# Patient Record
Sex: Female | Born: 1985 | Hispanic: No | Marital: Married | Smoking: Never smoker
Health system: Southern US, Community
[De-identification: ages and names within clinical notes are randomized; demographics above are authoritative.]

## PROBLEM LIST (undated history)

## (undated) DIAGNOSIS — J45909 Unspecified asthma, uncomplicated: Secondary | ICD-10-CM

## (undated) DIAGNOSIS — G43909 Migraine, unspecified, not intractable, without status migrainosus: Secondary | ICD-10-CM

## (undated) DIAGNOSIS — F988 Other specified behavioral and emotional disorders with onset usually occurring in childhood and adolescence: Secondary | ICD-10-CM

## (undated) DIAGNOSIS — K900 Celiac disease: Secondary | ICD-10-CM

## (undated) DIAGNOSIS — F329 Major depressive disorder, single episode, unspecified: Secondary | ICD-10-CM

## (undated) DIAGNOSIS — I499 Cardiac arrhythmia, unspecified: Secondary | ICD-10-CM

## (undated) DIAGNOSIS — F32A Depression, unspecified: Secondary | ICD-10-CM

## (undated) HISTORY — DX: Migraine, unspecified, not intractable, without status migrainosus: G43.909

## (undated) HISTORY — DX: Other specified behavioral and emotional disorders with onset usually occurring in childhood and adolescence: F98.8

## (undated) HISTORY — DX: Cardiac arrhythmia, unspecified: I49.9

## (undated) HISTORY — PX: MANDIBLE SURGERY: SHX707

## (undated) HISTORY — PX: BREAST ENHANCEMENT SURGERY: SHX7

---
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
## 2005-10-01 ENCOUNTER — Other Ambulatory Visit: Admission: RE | Admit: 2005-10-01 | Discharge: 2005-10-01 | Payer: Self-pay | Admitting: Obstetrics and Gynecology

## 2007-06-17 ENCOUNTER — Encounter: Admission: RE | Admit: 2007-06-17 | Discharge: 2007-06-17 | Payer: Self-pay | Admitting: Gastroenterology

## 2008-01-07 ENCOUNTER — Inpatient Hospital Stay (HOSPITAL_COMMUNITY): Admission: EM | Admit: 2008-01-07 | Discharge: 2008-01-08 | Payer: Self-pay | Admitting: Emergency Medicine

## 2009-11-18 ENCOUNTER — Encounter: Admission: RE | Admit: 2009-11-18 | Discharge: 2009-11-18 | Payer: Self-pay | Admitting: Family Medicine

## 2010-08-21 ENCOUNTER — Emergency Department (HOSPITAL_COMMUNITY): Admission: EM | Admit: 2010-08-21 | Discharge: 2010-08-21 | Payer: Self-pay | Admitting: Emergency Medicine

## 2010-11-16 ENCOUNTER — Encounter: Payer: Self-pay | Admitting: Gastroenterology

## 2011-03-10 NOTE — H&P (Signed)
NAMELINEA, CALLES NO.:  [id number]   MEDICAL RECORD NO.:  [id number]          PATIENT TYPE:  INP   LOCATION:                               FACILITY:  [facility], [place]   PHYSICIAN:  Ramiro Harvest, MD    DATE OF BIRTH:  04-28-1986   DATE OF ADMISSION:  01/06/2008  DATE OF DISCHARGE:  01/08/2008                              HISTORY & PHYSICAL   PRIMARY CARE PHYSICIAN:  [facility].   GASTROENTEROLOGIST:  Everardo All. Madilyn Fireman, M.D. of [facility].   HISTORY OF PRESENT ILLNESS:  [name] is a 25 year old white female  with a history of celiac disease, depression who recently returned from  a honeymoon trip in [place] five days prior to admission who presented to  the ED with abdominal pain, worse in the right lower quadrant and  profuse diarrhea one day prior to admission.  The patient states that  since her return from [place] five days ago, she had gradual abdominal  distention, abdominal pressure more in the lower abdomen.  The patient  states one day prior to admission after lunch she started having profuse  diarrhea every 5-10 minutes, lightheadedness, abdominal pressure in the  lower abdomen right lower quadrant greater than the upper abdomen and  chills.  The patient denied any blood or mucus in stools.  No fever.  No  nausea.  No emesis.  No chest pain.  No shortness of breath.  No  palpitations.  The patient states she started a gluten free diet and was  prescribed antibiotics one month ago but did not like the taste and did  not take it.  The patient denies any well water.  The patient denies  dysuria, no vaginal discharge, no other associated symptoms.   ALLERGIES:  No known drug allergies.   PAST MEDICAL HISTORY:  1. Celiac disease.  2. Depression/anxiety.  3. Migraine headaches.   MEDICATIONS:  1. Celexa 20 mg daily.  2. Reglan 10 mg a.c. and h.s.  3. Relpax 40 mg p.r.n. migraine headache.  4. Oral contraceptive pills,  Seasonique.   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   FAMILY HISTORY:  Mother alive at 49 with a history of breast cancer,  status post cholecystectomy and migraines.  Father alive age 3 and  healthy.  One sister alive with migraine headaches and a history of  endometriosis.   REVIEW OF SYSTEMS:  Per HPI, otherwise negative.   PHYSICAL EXAMINATION:  VITAL SIGNS:  Temperature 98.3, blood pressure  123/75, pulse 78, respiratory rate 15, sating 100% on room air.  GENERAL:  No apparent distress.  HEENT:  Normocephalic atraumatic.  Pupils are equal, round, and reactive  to light.  Extraocular movements intact.  Oropharynx is clear.  There  are no lesions, no exudates.  NECK:  Supple.  No lymphadenopathy.  RESPIRATORY:  Lungs are clear to auscultation bilaterally.  CARDIOVASCULAR:  Regular rate and rhythm.  No murmurs, rubs, or gallops.  ABDOMEN:  Soft.  Tender to palpation in the right lower quadrant,  greater than the rest of the abdomen.  Positive bowel sounds.  No  rebound.  No guarding.  EXTREMITIES:  No clubbing, cyanosis, or edema.  NEUROLOGIC:  The patient is alert and oriented x3.  Cranial nerves II-  XII are grossly intact.  No focal deficits.   LABORATORY:  Sodium 137, potassium 3.8, chloride 105, bicarb 25, BUN 9,  creatinine 0.87, glucose 103.  Calcium 8.6, albumin 3.3, bilirubin 0.8,  alk phosphatase 53, AST 18, ALT 12, protein 5.9.  UA negative.  CBC:  White count 12.5, hemoglobin 13.7, platelets 233, hematocrit 39.3, ANC  9.9.  Wet prep:  Rare yeast, Trichomonas none seen, clue cells moderate,  WBCs moderate.  CT of the abdomen and pelvis preliminary shows wall  thickening of the jejunum and diffuse colonic wall thickening on the  right side, small bowel findings could be related to history of celiac  disease with colitis,  raises concern for secondary infectious etiology.   ASSESSMENT/:  [name] is a 25 year old white female recently  married, recently returned from a trip to [place] with a history of  celiac disease who presents with worsening right lower quadrant  abdominal pain and profuse diarrhea.   PLAN:  1. Colitis, likely an infectious colitis from her recent travel,      versus inflammatory bowel disease which is unlikely, versus celiac      disease which is unlikely as the patient started a gluten-free      diet, versus a tropical sprue disease.  Check blood cultures x2.      Check stool for ova and parasites, enteric pathogens, E. coli,      fecal leukocytes, and culture C. diff as well.  We will hydrate      with IV fluids, treat empirically with Cipro, Flagyl and follow.  2. Bacterial vaginosis.  Check a urine pregnancy test.  Her UA was      negative for UTI.  We will treat her with Flagyl 500 mg p.o. b.i.d.      x1 week.  3. Depression.  Celexa 20 mg daily.  4. Orthostasis.  Hydrate with IV fluids and follow.  5. Migraines.  Relpax p.r.n.  6. Celiac disease.  Gluten-free diet.  7. Prophylaxis.  Protonix for GI prophylaxis.  SCDs for DVT      prophylaxis.   It has been a pleasure taking care of Ms. [name].      Ramiro Harvest, MD  Electronically Signed     DT/MEDQ  D:  01/06/2008  T:  01/06/2008  Job:  [id number]   cc:   Everardo All. Madilyn Fireman, M.D.  Fax: [phone]

## 2011-03-13 NOTE — Discharge Summary (Signed)
[name], [name] NO.:  [id number]   MEDICAL RECORD NO.:  [id number]          PATIENT TYPE:  INP   LOCATION:  1515                         FACILITY:  [facility]   PHYSICIAN:  Ramiro Harvest, MD    DATE OF BIRTH:  1986-04-01   DATE OF ADMISSION:  01/06/2008  DATE OF DISCHARGE:  01/08/2008                               DISCHARGE SUMMARY   PRIMARY CARE PHYSICIAN:  Terrill Mohr, PA of [facility].   GASTROENTEROLOGIST:  Everardo All. Madilyn Fireman, M.D. of [facility].   DISCHARGE DIAGNOSES:  1. Infectious colitis, likely secondary to Escherichia coli.  Shiga      toxin 1 and 2 are positive.  2. Bacterial vaginosis.  3. Depression.  4. Orthostasis  5. Migraines.  6. Celiac disease.   DISCHARGE MEDICATIONS:  1. Ciprofloxacin 500 mg p.o. b.i.d. x 5 days.  2. Flagyl 500 mg p.o. b.i.d. x 5 days.  3. Percocet 5/325 1 tablet p.o. q.4 h. p.r.n. pain.  4. Imodium over-the-counter p.r.n.  5. Celexa 20 mg p.o. daily.  6. Oral contraceptive pills, Seasonique as previously taken.  7. Relpax 40 mg p.r.n. headache.   FOLLOW UP:  1. The patient is to follow up with PCP in 1 week.  2. Also to follow up with Dr. Madilyn Fireman of gastroenterology if no      improvement.  3. On followup, a comprehensive metabolic profile needs to be obtained      as well as a CBC to follow up on the patient's renal function and      hemoglobin.  Needs to follow up on the patient's renal function to      rule out HUS as Shiga toxin 1 and 2 were positive.  Also check a      bilirubin to rule out hemolysis if elevated bilirubin.  They want      to check an LDH and haptoglobin as well to rule out hemolysis.      Also I need to follow up on the patient's electrolytes as well.   PROCEDURES PERFORMED:  CT of the abdomen and pelvis were done on January 06, 2008 that showed multiple loops of small bowel wall thickening along  with colonic wall thickening.  Small bowel wall thickening edema  could  be secondary to the patient's celiac sprue and represent an  exacerbation.  However, colonic wall thickening raises concern for an  infectious etiology and colitis findings of unknown etiology.  No gross  pelvic abnormalities.  Tiny amount of pelvic free fluid.   CONSULTATIONS:  None.   ADMISSION HISTORY/PHYSICAL:  [name] is a 25 year old white  female with history of celiac disease, depression, who recently returned  from a honeymoon trip in [place] 5 days prior to admission, who presented  to the ED with abdominal pain, worse in the right lower quadrant and  profuse diarrhea 1 day prior to admission.  The patient states that  since her return from [place] 5 days prior to admission, she has gradual  abdominal distention, abdominal pressure more in the lower abdomen.  The  patient stated that 1 day prior to admission after lunch, she started  having profuse diarrhea every 5-10 minutes, lightheadedness, abdominal  pressure in the lower abdomen, right lower quadrant greater than upper  abdomen and some chills.  The patient denied any blood or mucus in the  stools.  No fever, no nausea, no emesis.  No chest pain, no shortness of  breath, no palpitations.  The patient stated that she was on a gluten-  free diet and was prescribed antibiotics 1 month ago, but did not like  the taste of them and as such did not take them.  The patient denies any  well water use.  The patient denies any dysuria, no vaginal discharge.  No other associated symptoms.   PHYSICAL EXAMINATION:  VITAL SIGNS:  On admission temperature 98.3,  blood pressure 123/75, pulse of 78, respiratory rate 15, satting 100% on  room air.  GENERAL:  The patient on gurney in no apparent distress.  HEENT:  Normocephalic, atraumatic.  Pupils are equal, round and reactive  to light.  Extraocular movements intact.  Oropharynx is clear.  No  lesions.  No exudate.  NECK:  Supple.  No lymphadenopathy.  RESPIRATORY:  Lungs  are clear to auscultation bilaterally.  CARDIOVASCULAR:  Regular rate and rhythm.  No murmurs, rubs or gallops.  ABDOMEN:  Soft, tender to palpation in the right lower quadrant, greater  than the rest of the abdomen, positive bowel sounds.  No rebound or  guarding.  EXTREMITIES:  No clubbing, cyanosis or edema.  NEUROLOGIC:  The patient was alert and oriented x3.  Cranial nerves II-  XII are grossly intact.  No focal deficits.   LABORATORY DATA:  On admission:  Sodium 137, potassium 3.8, chloride  105, bicarb 25, BUN 9, creatinine 0.87, glucose 103, calcium 8.6,  albumin 3.3, bilirubin 0.8, alk phosphatase 53, AST 18, ALT 12, protein  5.9.  UA negative.  CBC:  White count 12.5, hemoglobin 13.7, platelets  233, hematocrit 39.3, ANC of 9.9.  Wet prep showed rare yeast,  Trichomonas none was seen.  Clue cells, moderate WBC.  Discharge labs:  Stool toxin was positive for Shiga toxins 1 and 2.  Blood cultures were  negative x2.  Basic metabolic profile on day of discharge:  Sodium 137,  potassium 3.9, chloride 106, bicarb 27, BUN 2, creatinine 0.82, glucose  74,  calcium of 8.0 and a magnesium level of 1.9.   DIAGNOSTICS:  CT of the abdomen and pelvis preliminary showed wall  thickening of the jejunum and diffuse colonic wall thickening in the  right side.  Small bowel findings could be related to history of celiac  disease with colitis and raises questions of secondary infectious  etiology.   HOSPITAL COURSE:  1. Infectious colitis which was felt likely secondary to E. coli.  The      patient's stool studies were obtained.  It was noted that the      patient had recently returned from a honeymoon trip to [place].  It      was felt that colitis was likely an infectious colitis from her      recent travel versus inflammatory bowel disease.  Blood cultures      were checked x2.  Stool was checked for ova, parasites, enteric      pathogens, E. coli, fecal leukocytes, C. diff as well.  C. diff  was      negative.  The patient was placed on IV fluids for hydration  and      empirically started on Cipro and Flagyl.  The patient was monitored      throughout the hospitalization and C. diff came back negative.      Shiga toxin 1 and 2 came back positive.  Cultures were re-sent and      were pending.  By day of discharge, the patient was maintained on      Cipro and Flagyl, was hydrated with IV fluids and also was placed      on some Imodium with improvement with her diarrhea.  The patient      was symptomatically improved by day of discharge.  The patient will      be discharged home to follow up with PCP and a gastroenterologist      for followup.  On followup, the patient is to be monitored for      hemolytic uremic syndrome.  Her renal function needs to be      monitored as well as her electrolytes and hemoglobin for hemolysis      as patient's Shiga toxin 1 and 2 are positive and may have      contracted E. coli from that.  However, the patient was discharged      in stable and improved condition.  2. Bacterial vaginosis was noted by pelvic exam which was done in the      emergency room.  The patient was placed on metronidazole 500 mg      twice daily to complete a 1 week course of this.  3. Orthostasis.  The patient was noted to be orthostatic on      presentation and during the hospitalization.  Was placed on IV      fluids and aggressively hydrated.  The patient's orthostasis      resolved by day of discharge.  The patient was discharged in stable      and improved condition.  4. The rest of the patient's chronic medical issues were stable      throughout the hospitalization and the patient was maintained on      her home regimen.   On day of discharge vital signs:  Temperature 98.6, pulse of 76,  respirations 20, blood pressure 133/77, satting 97% on room air.   The patient will be discharged in stable and improved condition to  follow up with her PCP as stated above.    It was a pleasure taking care of Ms. [name].      Ramiro Harvest, MD  Electronically Signed     DT/MEDQ  D:  01/28/2008  T:  01/28/2008  Job:  [id number]   cc:   Everardo All. Madilyn Fireman, M.D.  Fax: [phone]   Terrill Mohr, PA

## 2011-07-20 LAB — EHEC TOXIN BY EIA, STOOL

## 2011-07-20 LAB — URINALYSIS, ROUTINE W REFLEX MICROSCOPIC
Bilirubin Urine: NEGATIVE
Glucose, UA: NEGATIVE
Hgb urine dipstick: NEGATIVE
Ketones, ur: NEGATIVE
Nitrite: NEGATIVE
Protein, ur: NEGATIVE
Specific Gravity, Urine: 1.025
Urobilinogen, UA: 0.2
pH: 6.5

## 2011-07-20 LAB — CBC
HCT: 35.2 — ABNORMAL LOW
HCT: 39.3
Hemoglobin: 12.2
Hemoglobin: 13.7
MCHC: 34.7
MCHC: 34.9
MCV: 90.3
MCV: 91
Platelets: 180
Platelets: 233
RBC: 3.87
RBC: 4.35
RDW: 12
RDW: 12.3
WBC: 12.5 — ABNORMAL HIGH
WBC: 7.1

## 2011-07-20 LAB — BASIC METABOLIC PANEL
BUN: 2 — ABNORMAL LOW
BUN: 2 — ABNORMAL LOW
CO2: 28
CO2: 27
Calcium: 7.9 — ABNORMAL LOW
Calcium: 8 — ABNORMAL LOW
Chloride: 107
Chloride: 106
Creatinine, Ser: 0.88
Creatinine, Ser: 0.82
GFR calc Af Amer: >60
GFR calc Af Amer: >60
GFR calc non Af Amer: >60
GFR calc non Af Amer: >60
Glucose, Bld: 88
Glucose, Bld: 74
Potassium: 4.1
Potassium: 3.9
Sodium: 138
Sodium: 137

## 2011-07-20 LAB — FECAL LACTOFERRIN, QUANT: Fecal Lactoferrin: POSITIVE

## 2011-07-20 LAB — WET PREP, GENITAL: Trich, Wet Prep: NONE SEEN

## 2011-07-20 LAB — CULTURE, BLOOD (ROUTINE X 2)
Culture: NO GROWTH
Culture: NO GROWTH

## 2011-07-20 LAB — COMPREHENSIVE METABOLIC PANEL
ALT: 12
AST: 18
Albumin: 3.3 — ABNORMAL LOW
Alkaline Phosphatase: 53
BUN: 9
CO2: 25
Calcium: 8.6
Chloride: 105
Creatinine, Ser: 0.87
GFR calc Af Amer: >60
GFR calc non Af Amer: >60
Glucose, Bld: 103 — ABNORMAL HIGH
Potassium: 3.8
Sodium: 137
Total Bilirubin: 0.8
Total Protein: 5.9 — ABNORMAL LOW

## 2011-07-20 LAB — CLOSTRIDIUM DIFFICILE EIA: C difficile Toxins A+B, EIA: NEGATIVE

## 2011-07-20 LAB — DIFFERENTIAL
Basophils Absolute: 0
Basophils Absolute: 0
Basophils Relative: 0
Basophils Relative: 0
Eosinophils Absolute: 0.1
Eosinophils Absolute: 0.2
Eosinophils Relative: 0
Eosinophils Relative: 3
Lymphocytes Relative: 12
Lymphocytes Relative: 28
Lymphs Abs: 1.4
Lymphs Abs: 2
Monocytes Absolute: 0.9
Monocytes Absolute: 1
Monocytes Relative: 12
Monocytes Relative: 8
Neutro Abs: 4
Neutro Abs: 9.9 — ABNORMAL HIGH
Neutrophils Relative %: 57
Neutrophils Relative %: 80 — ABNORMAL HIGH

## 2011-07-20 LAB — GC/CHLAMYDIA PROBE AMP, GENITAL
Chlamydia, DNA Probe: NEGATIVE
GC Probe Amp, Genital: NEGATIVE

## 2011-07-20 LAB — STOOL CULTURE

## 2011-07-20 LAB — POCT PREGNANCY, URINE
Operator id: 26188
Preg Test, Ur: NEGATIVE

## 2011-07-20 LAB — OVA AND PARASITE EXAMINATION: Ova and parasites: NONE SEEN

## 2011-07-20 LAB — MAGNESIUM: Magnesium: 1.9

## 2012-01-22 ENCOUNTER — Ambulatory Visit (INDEPENDENT_AMBULATORY_CARE_PROVIDER_SITE_OTHER): Payer: BC Managed Care – PPO | Admitting: Family Medicine

## 2012-01-22 VITALS — BP 118/72 | HR 87 | Temp 98.5°F | Resp 16 | Ht 63.5 in | Wt 121.8 lb

## 2012-01-22 DIAGNOSIS — N912 Amenorrhea, unspecified: Secondary | ICD-10-CM

## 2012-01-22 LAB — POCT URINE PREGNANCY: Preg Test, Ur: NEGATIVE

## 2012-01-22 NOTE — Progress Notes (Signed)
  Patient Name: [name] Date of Birth: [birth year] Medical Record Number: [id number] Gender: female Date of Encounter: 01/22/2012  History of Present Illness:  [name] is a 26 y.o. very pleasant female patient who presents with the following:  She is on OCP and was expecting her period sometime this week- she is to start her new pack of pills on Monday.  She feels ok otherwise.  She has been taking her OCP regularly and has not missed any pills, no vomiting. However, she also has been on minocycline for acne for the last month.  She usually takes several packs in a row and then takes a week off to have a period a few times yearly.  She would be happy to be pregnant although she and her husband are not yet trying in earnest.  She is already on PNV  There is no problem list on file for this patient.  No past medical history on file. No past surgical history on file. History  Substance Use Topics  . Smoking status: Never Smoker   . Smokeless tobacco: Not on file  . Alcohol Use: Not on file   No family history on file. No Known Allergies  Medication list has been reviewed and updated.  Review of Systems: As per HPI- otherwise negative. No vaginal bleeding, no breast tenderness.  She does note a little cramping- "like I'm about to get my period."  Physical Examination: Filed Vitals:   01/22/12 1659  BP: 118/72  Pulse: 87  Temp: 98.5 F (36.9 C)  TempSrc: Oral  Resp: 16  Height: 5' 3.5" (1.613 m)  Weight: 121 lb 12.8 oz (55.248 kg)    Body mass index is 21.24 kg/(m^2).  GEN: WDWN, NAD, Non-toxic, A & O x 3 HEENT: Atraumatic, Normocephalic. Neck supple. No masses, No LAD. Ears and Nose: No external deformity. CV: RRR, No M/G/R. No JVD. No thrill. No extra heart sounds. PULM: CTA B, no wheezes, crackles, rhonchi. No retractions. No resp. distress. No accessory muscle use. ABD: S, NT, ND, +BS. No rebound. No HSM. EXTR: No c/c/e NEURO Normal gait.  PSYCH: Normally  interactive. Conversant. Not depressed or anxious appearing.  Calm demeanor.  Results for orders placed in visit on 01/22/12  POCT URINE PREGNANCY      Component Value Range   Preg Test, Ur Negative      Assessment and Plan: Delayed menses, either due to OCP use or early pregnancy.  Reassured patient that no major lifestyle changes are needed at this time.  She should continue to take an OTC urine HCG test every several days- she may go ahead and start her next pack of pills, but if no period after next pack and if not pregnant please call.  Sooner if any other concerns

## 2012-06-10 ENCOUNTER — Ambulatory Visit (INDEPENDENT_AMBULATORY_CARE_PROVIDER_SITE_OTHER): Payer: BC Managed Care – PPO | Admitting: Family Medicine

## 2012-06-10 VITALS — BP 116/79 | HR 84 | Temp 98.1°F | Resp 16 | Ht 63.5 in | Wt 120.0 lb

## 2012-06-10 DIAGNOSIS — R5383 Other fatigue: Secondary | ICD-10-CM

## 2012-06-10 DIAGNOSIS — E162 Hypoglycemia, unspecified: Secondary | ICD-10-CM

## 2012-06-10 DIAGNOSIS — R059 Cough, unspecified: Secondary | ICD-10-CM

## 2012-06-10 DIAGNOSIS — R05 Cough: Secondary | ICD-10-CM

## 2012-06-10 DIAGNOSIS — R5381 Other malaise: Secondary | ICD-10-CM

## 2012-06-10 LAB — COMPREHENSIVE METABOLIC PANEL
ALT: 13 U/L (ref 0–35)
AST: 16 U/L (ref 0–37)
Albumin: 4 g/dL (ref 3.5–5.2)
Alkaline Phosphatase: 53 U/L (ref 39–117)
BUN: 9 mg/dL (ref 6–23)
CO2: 29 mEq/L (ref 19–32)
Calcium: 9.3 mg/dL (ref 8.4–10.5)
Chloride: 100 mEq/L (ref 96–112)
Creat: 0.72 mg/dL (ref 0.50–1.10)
Glucose, Bld: 65 mg/dL — ABNORMAL LOW (ref 70–99)
Potassium: 4.8 mEq/L (ref 3.5–5.3)
Sodium: 139 mEq/L (ref 135–145)
Total Bilirubin: 0.6 mg/dL (ref 0.3–1.2)
Total Protein: 6.7 g/dL (ref 6.0–8.3)

## 2012-06-10 LAB — POCT CBC
Granulocyte percent: 53.3 %G (ref 37–80)
HCT, POC: 47.9 % (ref 37.7–47.9)
Hemoglobin: 15.5 g/dL (ref 12.2–16.2)
Lymph, poc: 3.1 (ref 0.6–3.4)
MCH, POC: 31 pg (ref 27–31.2)
MCHC: 32.4 g/dL (ref 31.8–35.4)
MCV: 95.8 fL (ref 80–97)
MID (cbc): 0.6 (ref 0–0.9)
MPV: 8.6 fL (ref 0–99.8)
POC Granulocyte: 4.2 (ref 2–6.9)
POC LYMPH PERCENT: 39.7 %L (ref 10–50)
POC MID %: 7 %M (ref 0–12)
Platelet Count, POC: 309 10*3/uL (ref 142–424)
RBC: 5 M/uL (ref 4.04–5.48)
RDW, POC: 12.5 %
WBC: 7.9 10*3/uL (ref 4.6–10.2)

## 2012-06-10 LAB — GLUCOSE, POCT (MANUAL RESULT ENTRY)
POC Glucose: 53 mg/dl — AB (ref 70–99)
POC Glucose: 100 mg/dl — AB (ref 70–99)

## 2012-06-10 LAB — TSH: TSH: 1.114 u[IU]/mL (ref 0.350–4.500)

## 2012-06-10 NOTE — Progress Notes (Signed)
[facility] [address] [place] [address] [phone]  Date:  06/10/2012   Name:  [name]   DOB:  November 09, 1985   MRN:  [id number]  PCP:  REDMON,NOELLE, PA    Chief Complaint: Cough and Fatigue   History of Present Illness:  [name] is a 26 y.o. very pleasant female patient who presents with the following:  In June she got an illness like the flu- she had a persistent cough and was seen at [facility]- [facility].  Treated with OTC medications and then a zpack and symbicort for possible walking pneumonia.  She got better temporarily, then sx returned and she was recently seen and treated with augmentin, albuterol and prednisone and cough syrup.  Again got better for a couple of days.  She is on Augmentin still- this is day 7 of 10.  She finished the prednisone- took it for 5 days and finished this past Tuesday.   "It's not working."  She is taking mucinex.  Albuterol does not seem to help.   Yesterday evening she felt worse- lightheaded, feels very tired and like her limbs are heavy.  She had a HA this morning, took tylenol.  Feels "exhausted."   They have not noted a fever or chills.  Occasional night sweats but these are not new.  Continues to cough. She sometimes coughs up a little mucus.    No runny nose, no sneezing.   No weight loss noted.  She uses her OCP on an extended regimen.   No recent travel.  She did have a CXR performed last week and it looked ok.   She did eat this morning, but had some homemade gluten free low- sugar muffins.  She has suffered from hypoglycemia in the past  There is no problem list on file for this patient.   No past medical history on file.  No past surgical history on file.  History  Substance Use Topics  . Smoking status: Never Smoker   . Smokeless tobacco: Not on file  . Alcohol Use: Not on file    No family history on file.  No Known Allergies  Medication list has been reviewed and updated.  Current  Outpatient Prescriptions on File Prior to Visit  Medication Sig Dispense Refill  . amphetamine-dextroamphetamine (ADDERALL) 20 MG tablet Take 20 mg by mouth 2 (two) times daily.      . citalopram (CELEXA) 20 MG tablet Take 20 mg by mouth daily.      Marland Kitchen ipratropium-albuterol (DUONEB) 0.5-2.5 (3) MG/3ML SOLN Take 3 mLs by nebulization.      . norgestimate-ethinyl estradiol (ORTHO-CYCLEN,SPRINTEC,PREVIFEM) 0.25-35 MG-MCG tablet Take 1 tablet by mouth daily.      . minocycline (DYNACIN) 75 MG tablet Take 75 mg by mouth 2 (two) times daily.      . SUMAtriptan (IMITREX) 50 MG tablet Take 50 mg by mouth as needed.        Review of Systems:  As per HPI- otherwise negative.   Physical Examination: Filed Vitals:   06/10/12 0956  BP: 116/79  Pulse: 84  Temp: 98.1 F (36.7 C)  Resp: 16   Filed Vitals:   06/10/12 0956  Height: 5' 3.5" (1.613 m)  Weight: 120 lb (54.432 kg)   Body mass index is 20.92 kg/(m^2). Ideal Body Weight: Weight in (lb) to have BMI = 25: 143.1   GEN: WDWN, NAD, Non-toxic, A & O x 3, looks well HEENT: Atraumatic, Normocephalic. Neck supple.  No masses, No LAD.  Tm and oropharynx wnl, PEERL, EOMI.   Ears and Nose: No external deformity. CV: RRR, No M/G/R. No JVD. No thrill. No extra heart sounds. PULM: CTA B, no wheezes, crackles, rhonchi. No retractions. No resp. distress. No accessory muscle use. ABD: S, NT, ND, +BS. No rebound. No HSM. EXTR: No c/c/e NEURO Normal gait.  PSYCH: Normally interactive. Conversant. Not depressed or anxious appearing.  Calm demeanor.   Results for orders placed in visit on 06/10/12  POCT CBC      Component Value Range   WBC 7.9  4.6 - 10.2 K/uL   Lymph, poc 3.1  0.6 - 3.4   POC LYMPH PERCENT 39.7  10 - 50 %L   MID (cbc) 0.6  0 - 0.9   POC MID % 7.0  0 - 12 %M   POC Granulocyte 4.2  2 - 6.9   Granulocyte percent 53.3  37 - 80 %G   RBC 5.00  4.04 - 5.48 M/uL   Hemoglobin 15.5  12.2 - 16.2 g/dL   HCT, POC 16.1  09.6 - 47.9 %    MCV 95.8  80 - 97 fL   MCH, POC 31.0  27 - 31.2 pg   MCHC 32.4  31.8 - 35.4 g/dL   RDW, POC 04.5     Platelet Count, POC 309  142 - 424 K/uL   MPV 8.6  0 - 99.8 fL  GLUCOSE, POCT (MANUAL RESULT ENTRY)      Component Value Range   POC Glucose 53 (*) 70 - 99 mg/dl   She has eaten today- given 2 apple juice boxes.    Glucose then came to 100 and she did feel better  Assessment and Plan: 1. Cough  TB Skin Test  2. Malaise  POCT CBC, POCT glucose (manual entry), Comprehensive metabolic panel, TSH, POCT glucose (manual entry)  3. Hypoglycemia     Suspect that hypoglycemia may have been the cause of her acute malaise this morning.  Went over self- care for when she has these episodes- a concenetrated source of sugar such as juice or jam is a good idea .  She had 2 juice boxes and felt a lot better.  Otherwise it sounds as through her illness has been cared for appropriately.  Finish augmentin, but let me know if she does not get better- Sooner if worse.      Abbe Amsterdam, MD

## 2012-06-11 ENCOUNTER — Encounter: Payer: Self-pay | Admitting: Family Medicine

## 2012-06-12 ENCOUNTER — Ambulatory Visit (INDEPENDENT_AMBULATORY_CARE_PROVIDER_SITE_OTHER): Payer: BC Managed Care – PPO | Admitting: Family Medicine

## 2012-06-12 DIAGNOSIS — Z111 Encounter for screening for respiratory tuberculosis: Secondary | ICD-10-CM

## 2012-06-12 LAB — TB SKIN TEST
Induration: 0 mm
TB Skin Test: NEGATIVE

## 2012-10-26 NOTE — L&D Delivery Note (Signed)
Operative Delivery Note At 2:03 AM a healthy female was delivered via Vaginal, Vacuum Investment banker, operational).  Presentation: vertex; Position: Right,, Occiput,; Station: +3.  A vacuum extraction was decided because of prolonged decelerations over 1-3 min in the 70-80's with the 3 first pushes.  Verbal consent: obtained from patient.  Risks and benefits discussed in detail.  Risks include, but are not limited to the risks of anesthesia, bleeding, infection, damage to maternal tissues, fetal cephalhematoma.  There is also the risk of inability to effect vaginal delivery of the head, or shoulder dystocia that cannot be resolved by established maneuvers, leading to the need for emergency cesarean section. Easy application.  Tractions in safety zone over 4 UCs.    APGAR: 8, 9; weight .   Placenta status: Intact, Spontaneous.   Cord: 3 vessels with the following complications: None.  Cord pH: Not done  Anesthesia: Epidural  Instruments: Bell shape Vacuum Episiotomy: None Lacerations: 2nd degree;Perineal Suture Repair: 2.0 3.0 vicryl rapide Est. Blood Loss (mL): 300  Mom to postpartum.  Baby to Nursery.  Fredy Gladu,MARIE-LYNE 10/20/2013, 3:36 AM

## 2013-03-13 LAB — OB RESULTS CONSOLE HIV ANTIBODY (ROUTINE TESTING): HIV: NONREACTIVE

## 2013-03-13 LAB — OB RESULTS CONSOLE ABO/RH: RH Type: NEGATIVE

## 2013-03-13 LAB — OB RESULTS CONSOLE ANTIBODY SCREEN: Antibody Screen: NEGATIVE

## 2013-03-13 LAB — OB RESULTS CONSOLE RUBELLA ANTIBODY, IGM: Rubella: IMMUNE

## 2013-03-13 LAB — OB RESULTS CONSOLE RPR: RPR: NONREACTIVE

## 2013-03-13 LAB — OB RESULTS CONSOLE HEPATITIS B SURFACE ANTIGEN: Hepatitis B Surface Ag: NEGATIVE

## 2013-03-27 LAB — OB RESULTS CONSOLE GC/CHLAMYDIA
Chlamydia: NEGATIVE
Gonorrhea: NEGATIVE

## 2013-05-16 ENCOUNTER — Emergency Department (HOSPITAL_COMMUNITY)
Admission: EM | Admit: 2013-05-16 | Discharge: 2013-05-16 | Disposition: A | Discharge status: Home / Self Care | Payer: BC Managed Care – PPO | Attending: Emergency Medicine | Admitting: Emergency Medicine

## 2013-05-16 ENCOUNTER — Encounter (HOSPITAL_COMMUNITY): Payer: Self-pay

## 2013-05-16 DIAGNOSIS — O9989 Other specified diseases and conditions complicating pregnancy, childbirth and the puerperium: Secondary | ICD-10-CM | POA: Insufficient documentation

## 2013-05-16 DIAGNOSIS — Y92410 Unspecified street and highway as the place of occurrence of the external cause: Secondary | ICD-10-CM | POA: Insufficient documentation

## 2013-05-16 DIAGNOSIS — Z043 Encounter for examination and observation following other accident: Secondary | ICD-10-CM | POA: Insufficient documentation

## 2013-05-16 DIAGNOSIS — Y9389 Activity, other specified: Secondary | ICD-10-CM | POA: Insufficient documentation
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
## 2013-05-17 ENCOUNTER — Telehealth (HOSPITAL_COMMUNITY): Payer: Self-pay | Admitting: Emergency Medicine

## 2013-05-17 NOTE — Telephone Encounter (Signed)
OB-GYN office called and wanted a report on ultrasound done on 05/16/13. The ultrasound was done at the bedside by Dr Jeraldine Loots and only reported an intrauterine pregancy.

## 2013-08-02 ENCOUNTER — Inpatient Hospital Stay (HOSPITAL_COMMUNITY): Admission: AD | Admit: 2013-08-02 | Payer: Self-pay | Source: Ambulatory Visit | Admitting: Obstetrics & Gynecology

## 2013-10-19 ENCOUNTER — Encounter (HOSPITAL_COMMUNITY): Payer: Self-pay | Admitting: *Deleted

## 2013-10-19 ENCOUNTER — Encounter (HOSPITAL_COMMUNITY): Payer: BC Managed Care – PPO | Admitting: Anesthesiology

## 2013-10-19 ENCOUNTER — Inpatient Hospital Stay (HOSPITAL_COMMUNITY)
Admission: AD | Admit: 2013-10-19 | Discharge: 2013-10-22 | DRG: 775 | Disposition: A | Discharge status: Home / Self Care | Payer: BC Managed Care – PPO | Source: Ambulatory Visit | Attending: Obstetrics & Gynecology | Admitting: Obstetrics & Gynecology

## 2013-10-19 ENCOUNTER — Inpatient Hospital Stay (HOSPITAL_COMMUNITY): Payer: BC Managed Care – PPO | Admitting: Anesthesiology

## 2013-10-19 HISTORY — DX: Celiac disease: K90.0

## 2013-10-19 HISTORY — DX: Unspecified asthma, uncomplicated: J45.909

## 2013-10-19 LAB — POCT FERN TEST: POCT Fern Test: NEGATIVE

## 2013-10-19 LAB — CBC
HCT: 36.3 % (ref 36.0–46.0)
Hemoglobin: 12.9 g/dL (ref 12.0–15.0)
MCH: 31.7 pg (ref 26.0–34.0)
MCHC: 35.5 g/dL (ref 30.0–36.0)
MCV: 89.2 fL (ref 78.0–100.0)
Platelets: 189 10*3/uL (ref 150–400)
RBC: 4.07 MIL/uL (ref 3.87–5.11)
RDW: 12.4 % (ref 11.5–15.5)
WBC: 13 10*3/uL — ABNORMAL HIGH (ref 4.0–10.5)

## 2013-10-19 LAB — OB RESULTS CONSOLE GBS: GBS: NEGATIVE

## 2013-10-19 MED ORDER — EPHEDRINE 5 MG/ML INJ
10.0000 mg | INTRAVENOUS | Status: DC | PRN
  Filled 2013-10-19: qty 4
  Filled 2013-10-19: qty 2

## 2013-10-19 MED ORDER — ACETAMINOPHEN 325 MG PO TABS: 650.0000 mg | ORAL_TABLET | ORAL | Status: DC | PRN

## 2013-10-19 MED ORDER — OXYTOCIN BOLUS FROM INFUSION: 500.0000 mL | INTRAVENOUS | Status: DC

## 2013-10-19 MED ORDER — OXYTOCIN 40 UNITS IN LACTATED RINGERS INFUSION - SIMPLE MED
62.5000 mL/h | INTRAVENOUS | Status: DC
  Administered 2013-10-20: 62.5 mL/h via INTRAVENOUS
  Filled 2013-10-19: qty 1000

## 2013-10-19 MED ORDER — LIDOCAINE HCL (PF) 1 % IJ SOLN
INTRAMUSCULAR | Status: DC | PRN
  Administered 2013-10-19 (×2): 9 mL

## 2013-10-19 MED ORDER — LACTATED RINGERS IV SOLN: 500.0000 mL | INTRAVENOUS | Status: DC | PRN

## 2013-10-19 MED ORDER — FENTANYL 2.5 MCG/ML BUPIVACAINE 1/10 % EPIDURAL INFUSION (WH - ANES)
INTRAMUSCULAR | Status: DC | PRN
  Administered 2013-10-19: 14 mL/h via EPIDURAL

## 2013-10-19 MED ORDER — CITRIC ACID-SODIUM CITRATE 334-500 MG/5ML PO SOLN: 30.0000 mL | ORAL | Status: DC | PRN

## 2013-10-19 MED ORDER — DIPHENHYDRAMINE HCL 50 MG/ML IJ SOLN: 12.5000 mg | INTRAMUSCULAR | Status: DC | PRN

## 2013-10-19 MED ORDER — OXYCODONE-ACETAMINOPHEN 5-325 MG PO TABS: 1.0000 | ORAL_TABLET | ORAL | Status: DC | PRN

## 2013-10-19 MED ORDER — FENTANYL 2.5 MCG/ML BUPIVACAINE 1/10 % EPIDURAL INFUSION (WH - ANES)
14.0000 mL/h | INTRAMUSCULAR | Status: DC | PRN
  Filled 2013-10-19: qty 125

## 2013-10-19 MED ORDER — LACTATED RINGERS IV SOLN
500.0000 mL | Freq: Once | INTRAVENOUS | Status: AC
  Administered 2013-10-19: 500 mL via INTRAVENOUS

## 2013-10-19 MED ORDER — EPHEDRINE 5 MG/ML INJ
10.0000 mg | INTRAVENOUS | Status: DC | PRN
  Filled 2013-10-19: qty 2

## 2013-10-19 MED ORDER — ONDANSETRON HCL 4 MG/2ML IJ SOLN: 4.0000 mg | Freq: Four times a day (QID) | INTRAMUSCULAR | Status: DC | PRN

## 2013-10-19 MED ORDER — PHENYLEPHRINE 40 MCG/ML (10ML) SYRINGE FOR IV PUSH (FOR BLOOD PRESSURE SUPPORT)
80.0000 ug | PREFILLED_SYRINGE | INTRAVENOUS | Status: DC | PRN
  Filled 2013-10-19: qty 10
  Filled 2013-10-19: qty 2

## 2013-10-19 MED ORDER — IBUPROFEN 600 MG PO TABS: 600.0000 mg | ORAL_TABLET | Freq: Four times a day (QID) | ORAL | Status: DC | PRN

## 2013-10-19 MED ORDER — FLEET ENEMA 7-19 GM/118ML RE ENEM: 1.0000 | ENEMA | RECTAL | Status: DC | PRN

## 2013-10-19 MED ORDER — PHENYLEPHRINE 40 MCG/ML (10ML) SYRINGE FOR IV PUSH (FOR BLOOD PRESSURE SUPPORT)
80.0000 ug | PREFILLED_SYRINGE | INTRAVENOUS | Status: DC | PRN
  Filled 2013-10-19: qty 2

## 2013-10-19 MED ORDER — LIDOCAINE HCL (PF) 1 % IJ SOLN
30.0000 mL | INTRAMUSCULAR | Status: DC | PRN
  Filled 2013-10-19 (×2): qty 30

## 2013-10-19 MED ORDER — LACTATED RINGERS IV SOLN
INTRAVENOUS | Status: DC
  Administered 2013-10-19: 22:00:00 via INTRAVENOUS

## 2013-10-19 NOTE — Anesthesia Procedure Notes (Signed)
Epidural Patient location during procedure: OB Start time: 10/19/2013 9:04 PM End time: 10/19/2013 9:08 PM  Staffing Anesthesiologist: Leilani Able Performed by: anesthesiologist   Preanesthetic Checklist Completed: patient identified, surgical consent, pre-op evaluation, timeout performed, IV checked, risks and benefits discussed and monitors and equipment checked  Epidural Patient position: sitting Prep: site prepped and draped and DuraPrep Patient monitoring: continuous pulse ox and blood pressure Approach: midline Injection technique: LOR air  Needle:  Needle type: Tuohy  Needle gauge: 17 G Needle length: 9 cm and 9 Needle insertion depth: 5 cm cm Catheter type: closed end flexible Catheter size: 19 Gauge Catheter at skin depth: 10 cm Test dose: negative and Other  Assessment Sensory level: T9 Events: blood not aspirated, injection not painful, no injection resistance, negative IV test and no paresthesia  Additional Notes Reason for block:procedure for pain

## 2013-10-19 NOTE — MAU Note (Signed)
Contractions, reports ROM at 1700.

## 2013-10-19 NOTE — Anesthesia Preprocedure Evaluation (Signed)
Anesthesia Evaluation  Patient identified by MRN, date of birth, ID band Patient awake    Reviewed: Allergy & Precautions, H&P , Patient's Chart, lab work & pertinent test results  Airway Mallampati: I TM Distance: >3 FB Neck ROM: full    Dental no notable dental hx.    Pulmonary    Pulmonary exam normal       Cardiovascular negative cardio ROS      Neuro/Psych negative neurological ROS  negative psych ROS   GI/Hepatic negative GI ROS, Neg liver ROS,   Endo/Other  negative endocrine ROS  Renal/GU negative Renal ROS  negative genitourinary   Musculoskeletal negative musculoskeletal ROS (+)   Abdominal Normal abdominal exam  (+)   Peds  Hematology negative hematology ROS (+)   Anesthesia Other Findings   Reproductive/Obstetrics (+) Pregnancy                           Anesthesia Physical Anesthesia Plan  ASA: II  Anesthesia Plan: Epidural   Post-op Pain Management:    Induction:   Airway Management Planned:   Additional Equipment:   Intra-op Plan:   Post-operative Plan:   Informed Consent: I have reviewed the patients History and Physical, chart, labs and discussed the procedure including the risks, benefits and alternatives for the proposed anesthesia with the patient or authorized representative who has indicated his/her understanding and acceptance.     Plan Discussed with:   Anesthesia Plan Comments:         Anesthesia Quick Evaluation

## 2013-10-19 NOTE — H&P (Signed)
Subjective:  [name] is a 27 y.o. G1 P0 female with EDC 10/21/2013 at 6 and 5/[redacted] weeks gestation who is being admitted for labor management.  Her current obstetrical history is wnl.  Patient reports no complaints.  Had SROM, clear AF with painful UCs.  Fetal Movement: normal.     Objective:   Vital signs in last 24 hours: Temp:  [97.7 F (36.5 C)-98.8 F (37.1 C)] 97.7 F (36.5 C) (12/25 2038) Pulse Rate:  [80-114] 80 (12/25 2229) Resp:  [18-20] 20 (12/25 2120) BP: (100-130)/(48-84) 109/68 mmHg (12/25 2229) SpO2:  [97 %-100 %] 99 % (12/25 2145) Weight:  [73.483 kg (162 lb)] 73.483 kg (162 lb) (12/25 1912)   General:   alert  Skin:   normal  HEENT:  PERRLA  Lungs:   clear to auscultation bilaterally  Heart:   regular rate and rhythm  Breasts:   Deferred  Abdomen:  Gravid  Pelvis:  Exam deferred.  FHT:  140 BPM  Variability present, no deceleration  Uterine Size: size equals dates  Presentations: cephalic  Cervix:    Dilation: 4 cm   Effacement: 80%   Station:  -1   Consistency: soft   Position: anterior   Lab Review  Rh -, Rhophylac received  AFP:NML, Ultrascreen neg  One hour GTT: Normal   GBS neg  Assessment/Plan:  39 and 5/[redacted] weeks gestation. Active phase labor after SROM.  Obstetrical history is wnl.  Fetal well-being reassuring.    Risks, benefits, alternatives and possible complications have been discussed in detail with the patient.  Pre-admission, admission, and post admission procedures and expectations were discussed in detail.  All questions answered, all appropriate consents will be signed at the Hospital. Admission is planned for today.  Expectant management.

## 2013-10-20 ENCOUNTER — Encounter (HOSPITAL_COMMUNITY): Payer: Self-pay | Admitting: *Deleted

## 2013-10-20 ENCOUNTER — Other Ambulatory Visit: Payer: Self-pay | Admitting: Obstetrics and Gynecology

## 2013-10-20 LAB — CBC
HCT: 32.1 % — ABNORMAL LOW (ref 36.0–46.0)
Hemoglobin: 11.3 g/dL — ABNORMAL LOW (ref 12.0–15.0)
MCH: 31.3 pg (ref 26.0–34.0)
MCHC: 35.2 g/dL (ref 30.0–36.0)
MCV: 88.9 fL (ref 78.0–100.0)
Platelets: 155 10*3/uL (ref 150–400)
RBC: 3.61 MIL/uL — ABNORMAL LOW (ref 3.87–5.11)
RDW: 12.4 % (ref 11.5–15.5)
WBC: 19 10*3/uL — ABNORMAL HIGH (ref 4.0–10.5)

## 2013-10-20 LAB — ABO/RH: ABO/RH(D): B NEG

## 2013-10-20 LAB — RPR: RPR Ser Ql: NONREACTIVE

## 2013-10-20 MED ORDER — ONDANSETRON HCL 4 MG/2ML IJ SOLN: 4.0000 mg | INTRAMUSCULAR | Status: DC | PRN

## 2013-10-20 MED ORDER — ZOLPIDEM TARTRATE 5 MG PO TABS: 5.0000 mg | ORAL_TABLET | Freq: Every evening | ORAL | Status: DC | PRN

## 2013-10-20 MED ORDER — BENZOCAINE-MENTHOL 20-0.5 % EX AERO
1.0000 "application " | INHALATION_SPRAY | CUTANEOUS | Status: DC | PRN
  Administered 2013-10-20 – 2013-10-22 (×2): 1 via TOPICAL
  Filled 2013-10-20 (×2): qty 56

## 2013-10-20 MED ORDER — IBUPROFEN 600 MG PO TABS
600.0000 mg | ORAL_TABLET | Freq: Four times a day (QID) | ORAL | Status: DC
  Administered 2013-10-20 – 2013-10-22 (×10): 600 mg via ORAL
  Filled 2013-10-20 (×11): qty 1

## 2013-10-20 MED ORDER — SENNOSIDES-DOCUSATE SODIUM 8.6-50 MG PO TABS
2.0000 | ORAL_TABLET | ORAL | Status: DC
  Administered 2013-10-20 – 2013-10-21 (×2): 2 via ORAL
  Filled 2013-10-20 (×2): qty 2

## 2013-10-20 MED ORDER — OXYCODONE-ACETAMINOPHEN 5-325 MG PO TABS
1.0000 | ORAL_TABLET | ORAL | Status: DC | PRN
  Administered 2013-10-20 – 2013-10-22 (×10): 1 via ORAL
  Filled 2013-10-20 (×10): qty 1

## 2013-10-20 MED ORDER — DIBUCAINE 1 % RE OINT: 1.0000 "application " | TOPICAL_OINTMENT | RECTAL | Status: DC | PRN

## 2013-10-20 MED ORDER — TETANUS-DIPHTH-ACELL PERTUSSIS 5-2.5-18.5 LF-MCG/0.5 IM SUSP: 0.5000 mL | Freq: Once | INTRAMUSCULAR | Status: DC

## 2013-10-20 MED ORDER — ONDANSETRON HCL 4 MG PO TABS: 4.0000 mg | ORAL_TABLET | ORAL | Status: DC | PRN

## 2013-10-20 MED ORDER — DIPHENHYDRAMINE HCL 25 MG PO CAPS
25.0000 mg | ORAL_CAPSULE | Freq: Four times a day (QID) | ORAL | Status: DC | PRN
  Filled 2013-10-20: qty 1

## 2013-10-20 MED ORDER — WITCH HAZEL-GLYCERIN EX PADS: 1.0000 "application " | MEDICATED_PAD | CUTANEOUS | Status: DC | PRN

## 2013-10-20 MED ORDER — SIMETHICONE 80 MG PO CHEW: 80.0000 mg | CHEWABLE_TABLET | ORAL | Status: DC | PRN

## 2013-10-20 MED ORDER — PRENATAL MULTIVITAMIN CH
1.0000 | ORAL_TABLET | Freq: Every day | ORAL | Status: DC
  Administered 2013-10-20 – 2013-10-22 (×3): 1 via ORAL
  Filled 2013-10-20 (×3): qty 1

## 2013-10-20 MED ORDER — LANOLIN HYDROUS EX OINT: TOPICAL_OINTMENT | CUTANEOUS | Status: DC | PRN

## 2013-10-20 MED ORDER — OXYTOCIN 40 UNITS IN LACTATED RINGERS INFUSION - SIMPLE MED: 62.5000 mL/h | INTRAVENOUS | Status: DC | PRN

## 2013-10-20 MED ORDER — PNEUMOCOCCAL VAC POLYVALENT 25 MCG/0.5ML IJ INJ
0.5000 mL | INJECTION | INTRAMUSCULAR | Status: DC
  Filled 2013-10-20: qty 0.5

## 2013-10-20 NOTE — Progress Notes (Signed)
Pt instructed she may begin pushing with ctxs. 

## 2013-10-20 NOTE — Anesthesia Postprocedure Evaluation (Signed)
  Anesthesia Post-op Note  Patient: [name]  Procedure(s) Performed: * No procedures listed *  Patient Location: Mother/Baby  Anesthesia Type:Epidural  Level of Consciousness: awake, oriented and patient cooperative  Airway and Oxygen Therapy: Patient Spontanous Breathing  Post-op Pain: none  Post-op Assessment: Patient's Cardiovascular Status Stable, Respiratory Function Stable, Patent Airway, No signs of Nausea or vomiting, Adequate PO intake, Pain level controlled, No headache, No backache, No residual numbness and No residual motor weakness  Post-op Vital Signs: Reviewed and stable  Complications: No apparent anesthesia complications

## 2013-10-20 NOTE — Lactation Note (Signed)
This note was copied from the chart of [name]. Lactation Consultation Note Initial consultation for this first time mom; dad request LC assistance; baby 6 hours old. Mom holding baby swaddled; demonstrated positioning using a doll, and enc mom to remove the blanket for better position. Assisted mom and dad to place baby STS in football on left side. Demonstrated hand expression; mom able to easily hand express large drops colostrum. However, the baby was not interested and not showing hunger cues at this time.  Enc mom to continue frequent STS and cue based feeding, and to call for help if needed. Reviewed baby and me book breastfeeding basics, questions answered.  Informed mom of lactation services, community resources, and BFSG.  Mom reports a history of breast augmentation for cosmetic reasons. Mom had no difficulty getting pregnant after stopping birth control. Discussed with mom how to know baby is getting enough milk. Inst mom to watch for signs of baby getting enough and to seek help if she has any concerns.  Patient Name: [name] [id number] Date: 10/20/2013 Reason for consult: Initial assessment   Maternal Data Formula Feeding for Exclusion: No Has patient been taught Hand Expression?: Yes Does the patient have breastfeeding experience prior to this delivery?: No  Feeding Feeding Type: Breast Fed Length of feed: 0 min  LATCH Score/Interventions Latch: Too sleepy or reluctant, no latch achieved, no sucking elicited.                    Lactation Tools Discussed/Used     Consult Status Consult Status: Follow-up Follow-up type: In-patient    [name] [facility] 10/20/2013, 10:48 AM

## 2013-10-20 NOTE — Progress Notes (Signed)
Patient ID: Rafaela Dinius, female   DOB: Aug 11, 1986, 27 y.o.   MRN: [id number] INTERVAL NOTE:  S:   Sitting in bed, lactation at bedside assisting with BFing, min cramping, (+) voids, small bleed, denies HA/NV/dizziness  O:   VSS, AAO x 3, NAD     A / P:   PPD #0  Stable post partum  Routine PP orders  Kenard Gower, MSN, CNM 10/20/2013, 9:15 AM

## 2013-10-20 NOTE — Plan of Care (Signed)
Problem: Phase II Progression Outcomes Goal: Rh isoimmunization per orders Outcome: Not Applicable Date Met:  10/20/13 Baby O-

## 2013-10-21 NOTE — Lactation Note (Signed)
This note was copied from the chart of Jamie Sanya Kobrin. Lactation Consultation Note Mom states breast feeding is going much better. States that baby just finished feeding well on the right side, now holding baby. Baby asleep. Mom states that she uses the nipple shield sometimes and sometimes baby latches well without it.  Enc mom to continue frequent STS and cue based breastfeeding, using the nipple shield if necessary. Questions answered.  Enc mom to call for help if needed.  Patient Name: [name] [id number] Date: 10/21/2013 Reason for consult: Follow-up assessment   Maternal Data    Feeding Feeding Type: Breast Fed Length of feed: 0 min  LATCH Score/Interventions                      Lactation Tools Discussed/Used     Consult Status Consult Status: PRN    Lenard Forth 10/21/2013, 3:38 PM

## 2013-10-21 NOTE — Progress Notes (Signed)
Patient ID: [name], female   DOB: [birth year], 27 y.o.   MRN: [id number] PPD # 1 SVD  S:  Reports feeling a little sore, but well overall             Tolerating po/ No nausea or vomiting             Bleeding is light             Pain controlled with ibuprofen (OTC) and narcotic analgesics including Percocet             Up ad lib / ambulatory / voiding without difficulties    Newborn  Information for the patient's newborn:  Casondra, Gasca [[id number]]  female  breast feeding  / Circumcision planning   O:  A & O x 3, in no apparent distress              VS:  Filed Vitals:   10/20/13 0545 10/20/13 0950 10/20/13 1845 10/21/13 0601  BP: 103/66 100/64 99/67 94/59   Pulse: 89 98 88 72  Temp: 98 F (36.7 C) 98.7 F (37.1 C) 97.8 F (36.6 C) 98.1 F (36.7 C)  TempSrc: Oral Oral Oral Oral  Resp: 20 18 18 18   Height:      Weight:      SpO2: 95%       LABS:  Recent Labs  10/19/13 2005 10/20/13 0600  WBC 13.0* 19.0*  HGB 12.9 11.3*  HCT 36.3 32.1*  PLT 189 155    Blood type: --/--/B NEG (12/25 2005)  Rubella: Immune (05/19 0000)   I&O: I/O last 3 completed shifts: In: -  Out: 850 [Urine:550; Blood:300]             Lungs: Clear and unlabored  Heart: regular rate and rhythm / no murmurs  Abdomen: soft, non-tender, non-distended              Fundus: firm, non-tender, U-1  Perineum: 2nd degree repair healing well - edematous - ice pack in place  Lochia: minimal  Extremities: no edema, no calf pain or tenderness, no Homans    A/P: PPD # 1  27 y.o., G1P1001   Principal Problem:   Vacuum extractor delivery, delivered (12/26) Active Problems:   Normal labor   Indication for care in labor or delivery   Postpartum state   Doing well - stable status  Routine post partum orders  Anticipate discharge tomorrow    [name], M, MSN, CNM 10/21/2013, 10:28 AM

## 2013-10-22 MED ORDER — IBUPROFEN 600 MG PO TABS: 600.0000 mg | ORAL_TABLET | Freq: Four times a day (QID) | ORAL | Status: DC

## 2013-10-22 MED ORDER — WITCH HAZEL-GLYCERIN EX PADS: 1.0000 "application " | MEDICATED_PAD | CUTANEOUS | Status: DC | PRN

## 2013-10-22 MED ORDER — BENZOCAINE-MENTHOL 20-0.5 % EX AERO: 1.0000 "application " | INHALATION_SPRAY | Freq: Three times a day (TID) | CUTANEOUS | Status: DC | PRN

## 2013-10-22 MED ORDER — OXYCODONE-ACETAMINOPHEN 5-325 MG PO TABS: 1.0000 | ORAL_TABLET | ORAL | Status: DC | PRN

## 2013-10-22 NOTE — Lactation Note (Signed)
This note was copied from the chart of [name]. Lactation Consultation Note Follow up visit with baby at 54 hours.  Mom reports breastfeeding with nipple shield is going well.  Outpt. Appt. For Jan 5, at 10:30 am scheduled for follow up.  Mom sees colostrum in nipple shield with most feedings.  Encouraged to do hand expression before and after feedings, feed with cues and as long as baby will actively nurse on demand.  Baby has only has one stool and one void in past 24 hours.  Latch score of 9 by [facility] RN.  Baby will have a circumcision today and will wait for discharge. Baby and me booklet reviewed regarding 8-10 feedings, and increase in voids and stools.   Patient Name: [name] [id number] Date: 10/22/2013 Reason for consult: Follow-up assessment   Maternal Data    Feeding Feeding Type: Breast Fed Length of feed: 25 min  LATCH Score/Interventions                Intervention(s): Breastfeeding basics reviewed     Lactation Tools Discussed/Used     Consult Status Consult Status: Complete Date: 10/30/13 (10;30) Follow-up type: Out-patient    Jannifer Rodney 10/22/2013, 8:48 AM

## 2013-10-22 NOTE — Discharge Summary (Signed)
Obstetric Discharge Summary Reason for Admission: onset of labor Prenatal Procedures: ultrasound Intrapartum Procedures: vacuum Postpartum Procedures: none Complications-Operative and Postpartum: 2nd degree perineal laceration Hemoglobin  Date Value Range Status  10/20/2013 11.3* 12.0 - 15.0 g/dL Final  1/61/0960 45.4  12.2 - 16.2 g/dL Final     HCT  Date Value Range Status  10/20/2013 32.1* 36.0 - 46.0 % Final     HCT, POC  Date Value Range Status  06/10/2012 47.9  37.7 - 47.9 % Final    Physical Exam:  General: alert, cooperative and no distress Lochia: appropriate Uterine Fundus: firm, midline, U-3 DVT Evaluation: No evidence of DVT seen on physical exam. Negative Homan's sign. No cords or calf tenderness. No significant calf/ankle edema.  Discharge Diagnoses: Term Pregnancy-delivered  Discharge Information: Date: 10/22/2013 Activity: pelvic rest and no driving while taking narcotics Diet: routine Medications: PNV, Ibuprofen, Colace and Percocet Condition: stable Instructions: refer to practice specific booklet Discharge to: home Follow-up Information   Follow up with LAVOIE,MARIE-LYNE, MD. Schedule an appointment as soon as possible for a visit in 6 weeks.   Specialty:  Obstetrics and Gynecology   Contact information:   [address] [place] [address] [phone]       Newborn Data: Live born female on 10/20/2013 Birth Weight: 7 lb 6 oz (3345 g) APGAR: 8, 9  Home with mother.  Kenard Gower, MSN, CNM 10/22/2013, 9:56 AM

## 2013-10-22 NOTE — Progress Notes (Signed)
Patient ID: [name], female   DOB: [birth year], 27 y.o.   MRN: [id number] Post Partum Day #2            Information for the patient's newborn:  [name], [name] [[id number]]  female   / circumcision done Feeding: breast  Subjective: No HA, SOB, CP, F/C, breast symptoms. Pain well-controlled with ibuprofen and Percocet. Normal vaginal bleeding, no clots.      Objective:  Temp:  [98.4 F (36.9 C)] 98.4 F (36.9 C) (12/28 0645) Pulse Rate:  [93] 93 (12/28 0645) Resp:  [18] 18 (12/28 0645) BP: (99)/(65) 99/65 mmHg (12/28 0645)  No intake or output data in the 24 hours ending 10/22/13 0951     Recent Labs  10/19/13 2005 10/20/13 0600  WBC 13.0* 19.0*  HGB 12.9 11.3*  HCT 36.3 32.1*  PLT 189 155    Blood type: --/--/B NEG (12/25 2005) Rubella: Immune (05/19 0000)    Physical Exam:  General: alert, cooperative and no distress Uterine Fundus: firm, midline, U-3 Lochia: appropriate Perineum: 2nd degree repair healing well, edema mild DVT Evaluation: No evidence of DVT seen on physical exam. Negative Homan's sign. No cords or calf tenderness. No significant calf/ankle edema.    Assessment/Plan: PPD # 2 / 27 y.o., G1P1001 S/P: vacuum extraction   Principal Problem:   Vacuum extractor delivery, delivered (12/26) Active Problems:   Normal labor   Indication for care in labor or delivery   Postpartum state   Normal postpartum exam  Continue current postpartum care  D/C home   LOS: 3 days   [name], M, MSN, CNM 10/22/2013, 9:51 AM

## 2013-10-24 ENCOUNTER — Inpatient Hospital Stay (HOSPITAL_COMMUNITY): Payer: BC Managed Care – PPO

## 2013-10-30 ENCOUNTER — Ambulatory Visit (HOSPITAL_COMMUNITY)
Admit: 2013-10-30 | Discharge: 2013-10-30 | Disposition: A | Discharge status: Home / Self Care | Payer: BC Managed Care – PPO | Attending: Obstetrics & Gynecology | Admitting: Obstetrics & Gynecology

## 2013-10-30 NOTE — Lactation Note (Addendum)
Adult Lactation Consultation Outpatient Visit Note: Mom with sore nipples during hospital stay and using NS  Patient Name: [name] Date of Birth: 03/11/1986 Gestational Age at Delivery: 4429w6d Type of Delivery:   Breastfeeding History: Frequency of Breastfeeding: q 2-3 hours Length of Feeding: 30-45 min Voids: Lots - Had large void while here for appointment Stools: Lots  Supplementing / Method: Pumping:  Type of Pump:   Frequency:  Volume:    Comments:    Consultation Evaluation:  Initial Feeding Assessment: Pre-feed Weight: 7- 6.9  3372g  Back to BW at 10 days Post-feed Weight: 7- 8.7  3422g Amount Transferred: 50 cc's Comments: Vickki MuffWeston took a few attempts but latched well without the nipple shield at this feeding. Mom reports that sometimes she can get him to latch without it but at other times he is too fussy, Encouraged to watch for feeding cues and feed as soon as she sees them before he gets frantic Lots of swallows heard. Weston nursed for 15 minutes then off to sleep. Too sleepy to latch to other breast. Mom asking about pumping and bottle feeding. Suggested waiting a few Mayline Dragon until Vickki MuffWeston is good at nursing without the NS. Reviewed milk storage with mom. Mom reports that nipples are a little tender since she has been nursing him more at the bare breast. New set of comfort gels given,.No further questions at present.     Total Breast milk Transferred this Visit: 50 Total Supplement Given: 0  Additional Interventions: Encouragement given.  To call prn  Follow-Up With Ped     Pamelia HoitWeeks, Wayman Hoard D 10/30/2013, 11:15 AM

## 2014-08-27 ENCOUNTER — Encounter (HOSPITAL_COMMUNITY): Payer: Self-pay | Admitting: *Deleted

## 2014-09-11 ENCOUNTER — Encounter: Payer: Self-pay | Admitting: Endocrinology

## 2014-09-11 ENCOUNTER — Ambulatory Visit (INDEPENDENT_AMBULATORY_CARE_PROVIDER_SITE_OTHER): Payer: BC Managed Care – PPO | Admitting: Endocrinology

## 2014-09-11 VITALS — BP 114/64 | HR 78 | Temp 98.2°F | Wt 116.0 lb

## 2014-09-11 DIAGNOSIS — R42 Dizziness and giddiness: Secondary | ICD-10-CM

## 2014-09-11 NOTE — Patient Instructions (Signed)
blood tests are being requested for you today.  We'll let you know about the results. Please continue to check the blood sugar if you have symptoms.  Please call if it is below 50.   I would be happy to see you back here whenever you want.

## 2014-09-11 NOTE — Progress Notes (Signed)
Subjective:    Patient ID: [name], female    DOB: 11/08/1985, 28 y.o.   MRN: [id number]  HPI Pt is 10 months postpartum.  She says she has had 2-3 years h/o moderate hypoglycemia.  She now has slight dizziness sensation in the head, and assoc fatigue.  These sxs are not predictably related to meals, but they usually improve with taking PO glucose.  she brings a record of her cbg's, checked during sxs, which i have reviewed today.  It varies from 84-108.  She says the sxs happen almost daily, and usually happen in am (before or after breakfast), or in the afternoon.  There is no trend throughout the day.  She gained 45 lbs with the pregnancy, and has since lost all of that.  She stopped breast feeding 2 mos ago, and has had 1 menstrual period since then.  She has no h/o liver, renal, or thyroid dz.   Past Medical History  Diagnosis Date  . Asthma     "mild"  . Celiac disease   . Normal labor 10/19/2013  . Vacuum extractor delivery, delivered (12/26) 10/20/2013    Past Surgical History  Procedure Laterality Date  . Breast enhancement surgery    . Mandible surgery      History   Social History  . Marital Status: Married    Spouse Name: N/A    Number of Children: N/A  . Years of Education: N/A   Occupational History  . Not on file.   Social History Main Topics  . Smoking status: Never Smoker   . Smokeless tobacco: Not on file  . Alcohol Use: No  . Drug Use: No  . Sexual Activity: Yes   Other Topics Concern  . Not on file   Social History Narrative    No current outpatient prescriptions on file prior to visit.   No current facility-administered medications on file prior to visit.    Allergies  Allergen Reactions  . Gluten Meal     Celiac disease    Family History  Problem Relation Age of Onset  . Diabetes Neg Hx     BP 114/64 mmHg  Pulse 78  Temp(Src) 98.2 F (36.8 C) (Oral)  Wt 116 lb (52.617 kg)  SpO2 98%  Review of Systems denies polyuria,  loss of smell, syncope, rash, abd pain, depression, headache, visual loss, galactorrhea, fever, easy bruising, change in facial appearance, rhinorrhea, and n/v.      Objective:   Physical Exam VS: see vs page GEN: no distress HEAD: head: no deformity eyes: no periorbital swelling, no proptosis external nose and ears are normal mouth: no lesion seen NECK: supple, thyroid is not enlarged CHEST WALL: no deformity LUNGS:  Clear to auscultation. CV: reg rate and rhythm, no murmur ABD: abdomen is soft, nontender.  no hepatosplenomegaly.  not distended.  no hernia MUSCULOSKELETAL: muscle bulk and strength are grossly normal.  no obvious joint swelling.  gait is normal and steady EXTEMITIES: no deformity.  no ulcer on the feet.  feet are of normal color and temp.  no edema PULSES: dorsalis pedis intact bilat.  no carotid bruit NEURO:  cn 2-12 grossly intact.   readily moves all 4's.  sensation is intact to touch on the feet SKIN:  Normal texture and temperature.  No rash or suspicious lesion is visible.   NODES:  None palpable at the neck PSYCH: alert, well-oriented.  Does not appear anxious nor depressed.    i have reviewed  the following old records: Office notes.  Pt was seen for the above sxs, and referred here.     Assessment & Plan:  Lightheadedness, new to me, uncertain etiology.  No current evidence of hypoglycemia.    Patient is advised the following: Patient Instructions  blood tests are being requested for you today.  We'll let you know about the results. Please continue to check the blood sugar if you have symptoms.  Please call if it is below 50.   I would be happy to see you back here whenever you want.

## 2014-09-28 ENCOUNTER — Ambulatory Visit (INDEPENDENT_AMBULATORY_CARE_PROVIDER_SITE_OTHER): Payer: BC Managed Care – PPO | Admitting: Emergency Medicine

## 2014-09-28 VITALS — BP 102/64 | HR 103 | Temp 99.1°F | Resp 16 | Ht 63.0 in | Wt 117.0 lb

## 2014-09-28 DIAGNOSIS — R059 Cough, unspecified: Secondary | ICD-10-CM

## 2014-09-28 DIAGNOSIS — R05 Cough: Secondary | ICD-10-CM

## 2014-09-28 DIAGNOSIS — J0100 Acute maxillary sinusitis, unspecified: Secondary | ICD-10-CM

## 2014-09-28 MED ORDER — AMOXICILLIN-POT CLAVULANATE 875-125 MG PO TABS: 1.0000 | ORAL_TABLET | Freq: Two times a day (BID) | ORAL | Status: DC

## 2014-09-28 MED ORDER — BENZONATATE 100 MG PO CAPS: 100.0000 mg | ORAL_CAPSULE | Freq: Three times a day (TID) | ORAL | Status: DC | PRN

## 2014-09-28 NOTE — Progress Notes (Signed)
Subjective:    Patient ID: [name], female    DOB: 08/04/1986, 28 y.o.   MRN: [id number]  PCP: REDMON,NOELLE, PA-C  Chief Complaint  Patient presents with  . Cough    x 1 week   . Sore Throat   Patient Active Problem List   Diagnosis Date Noted  . Lightheadedness 09/11/2014   Current Outpatient Prescriptions on File Prior to Visit  Medication Sig Dispense Refill  . levonorgestrel (MIRENA) 20 MCG/24HR IUD 1 each by Intrauterine route once.     No current facility-administered medications on file prior to visit.   Medications, allergies, past medical history, surgical history, family history, social history and problem list reviewed and updated.  HPI  89 yof with no pertinent PMH presents for one wk history cough and sore throat.  Sx started 8-9 days ago with prod cough with white sputum and sore throat. These sx have been fairly constant since onset. Has tried mucinex and otc cough medication with minimal relief. Four days started feeling head congestion. States congestion has been worsening past few days and that she has a lot of pain over her sinuses. Both ears have felt congested.   Denies abd pain, N/V, diarrhea. No fever or chills. Both son and husband have had cold sx recently.   Received flu vaccine this year.   HR slightly elevated today. States she is not feeling well.   Review of Systems No CP, SOB. No dysuria.     Objective:   Physical Exam  Constitutional: She is oriented to person, place, and time. She appears well-developed and well-nourished.  Non-toxic appearance. She does not have a sickly appearance. She appears ill. No distress.  BP 102/64 mmHg  Pulse 103  Temp(Src) 99.1 F (37.3 C) (Oral)  Resp 16  Ht 5\' 3"  (1.6 m)  Wt 117 lb (53.071 kg)  BMI 20.73 kg/m2  SpO2 98%   HENT:  Right Ear: Tympanic membrane is not erythematous, not retracted and not bulging. A middle ear effusion is present.  Left Ear: Tympanic membrane is not erythematous, not  retracted and not bulging. A middle ear effusion is present.  Nose: No mucosal edema or rhinorrhea. Right sinus exhibits maxillary sinus tenderness. Right sinus exhibits no frontal sinus tenderness. Left sinus exhibits maxillary sinus tenderness. Left sinus exhibits no frontal sinus tenderness.  Mouth/Throat: Oropharynx is clear and moist and mucous membranes are normal. No uvula swelling. No oropharyngeal exudate, posterior oropharyngeal edema, posterior oropharyngeal erythema or tonsillar abscesses.  Severe TTP over maxillary sinuses bilaterally.   Pulmonary/Chest: Effort normal and breath sounds normal. No tachypnea. She has no decreased breath sounds. She has no wheezes. She has no rhonchi. She has no rales.  Lymphadenopathy:       Head (right side): No submental, no submandibular and no tonsillar adenopathy present.       Head (left side): No submental, no submandibular and no tonsillar adenopathy present.    She has no cervical adenopathy.  Neurological: She is alert and oriented to person, place, and time.  Psychiatric: She has a normal mood and affect. Her speech is normal.      Assessment & Plan:   [age] yof with no pertinent PMH presents for one wk history cough and sore throat.  Acute maxillary sinusitis, recurrence not specified - Plan: amoxicillin-clavulanate (AUGMENTIN) 875-125 MG per tablet --Day 8-9 of uri sx, severe ttp over sinuses --augmentin for sinusitis --continue mucinex --rest/fluids  Cough - Plan: benzonatate (TESSALON) 100 MG capsule --tessalon --  rtc 48 hrs if not improving   Donnajean Lopesodd M. McVeigh, PA-C Physician Assistant-Certified [facility]  09/28/2014 12:43 PM

## 2014-10-02 ENCOUNTER — Other Ambulatory Visit: Payer: Self-pay | Admitting: Emergency Medicine

## 2014-10-02 ENCOUNTER — Telehealth: Payer: Self-pay

## 2014-10-02 DIAGNOSIS — J0100 Acute maxillary sinusitis, unspecified: Secondary | ICD-10-CM

## 2014-10-02 MED ORDER — AMOXICILLIN-POT CLAVULANATE 875-125 MG PO TABS: 1.0000 | ORAL_TABLET | Freq: Two times a day (BID) | ORAL | Status: DC

## 2014-10-02 NOTE — Telephone Encounter (Signed)
Call patient I have sent in a refill on her medications to take for another 5 days

## 2014-10-02 NOTE — Telephone Encounter (Signed)
Patient was recently treated for a sinus infection at our office. Per patient she was told by the provider if she wasn't any better to contact our office. Patient still has sore throat, sinus pressure,congestion, coughing and pressure in both of her ears.  Patient thinks she may need another round of antibiotics. Patient uses [facility] on [address] and her call back number is [phone].

## 2014-10-03 NOTE — Telephone Encounter (Signed)
Lm to advise pt. 

## 2014-10-04 ENCOUNTER — Other Ambulatory Visit: Payer: Self-pay

## 2014-10-04 DIAGNOSIS — J0100 Acute maxillary sinusitis, unspecified: Secondary | ICD-10-CM

## 2014-10-04 MED ORDER — AMOXICILLIN-POT CLAVULANATE 875-125 MG PO TABS: 1.0000 | ORAL_TABLET | Freq: Two times a day (BID) | ORAL | Status: DC

## 2014-10-04 NOTE — Telephone Encounter (Signed)
Pt called and stated pharm doesn't have Rx for Abx. I am resending it now.

## 2014-12-03 ENCOUNTER — Encounter (HOSPITAL_COMMUNITY): Payer: Self-pay | Admitting: Emergency Medicine

## 2014-12-03 ENCOUNTER — Emergency Department (HOSPITAL_COMMUNITY)
Admission: EM | Admit: 2014-12-03 | Discharge: 2014-12-03 | Disposition: A | Discharge status: Home / Self Care | Payer: BLUE CROSS/BLUE SHIELD | Attending: Emergency Medicine | Admitting: Emergency Medicine

## 2014-12-03 ENCOUNTER — Emergency Department (HOSPITAL_COMMUNITY): Payer: BLUE CROSS/BLUE SHIELD

## 2014-12-03 DIAGNOSIS — Z3202 Encounter for pregnancy test, result negative: Secondary | ICD-10-CM | POA: Insufficient documentation

## 2014-12-03 DIAGNOSIS — K529 Noninfective gastroenteritis and colitis, unspecified: Secondary | ICD-10-CM

## 2014-12-03 DIAGNOSIS — Z792 Long term (current) use of antibiotics: Secondary | ICD-10-CM | POA: Diagnosis not present

## 2014-12-03 DIAGNOSIS — J45909 Unspecified asthma, uncomplicated: Secondary | ICD-10-CM | POA: Insufficient documentation

## 2014-12-03 DIAGNOSIS — Z79899 Other long term (current) drug therapy: Secondary | ICD-10-CM | POA: Insufficient documentation

## 2014-12-03 DIAGNOSIS — R109 Unspecified abdominal pain: Secondary | ICD-10-CM | POA: Diagnosis present

## 2014-12-03 LAB — COMPREHENSIVE METABOLIC PANEL
ALT: 16 U/L (ref 0–35)
AST: 20 U/L (ref 0–37)
Albumin: 4.8 g/dL (ref 3.5–5.2)
Alkaline Phosphatase: 83 U/L (ref 39–117)
Anion gap: 8 (ref 5–15)
BUN: 11 mg/dL (ref 6–23)
CO2: 29 mmol/L (ref 19–32)
Calcium: 9.6 mg/dL (ref 8.4–10.5)
Chloride: 98 mmol/L (ref 96–112)
Creatinine, Ser: 0.79 mg/dL (ref 0.50–1.10)
GFR calc Af Amer: >90 mL/min (ref >90)
GFR calc non Af Amer: >90 mL/min (ref >90)
Glucose, Bld: 91 mg/dL (ref 70–99)
Potassium: 4.2 mmol/L (ref 3.5–5.1)
Sodium: 135 mmol/L (ref 135–145)
Total Bilirubin: 1 mg/dL (ref 0.3–1.2)
Total Protein: 7.6 g/dL (ref 6.0–8.3)

## 2014-12-03 LAB — CBC WITH DIFFERENTIAL/PLATELET
Basophils Absolute: 0 10*3/uL (ref 0.0–0.1)
Basophils Relative: 1 % (ref 0–1)
Eosinophils Absolute: 0 10*3/uL (ref 0.0–0.7)
Eosinophils Relative: 1 % (ref 0–5)
HCT: 46 % (ref 36.0–46.0)
Hemoglobin: 16.2 g/dL — ABNORMAL HIGH (ref 12.0–15.0)
Lymphocytes Relative: 34 % (ref 12–46)
Lymphs Abs: 2.1 10*3/uL (ref 0.7–4.0)
MCH: 30.4 pg (ref 26.0–34.0)
MCHC: 35.2 g/dL (ref 30.0–36.0)
MCV: 86.3 fL (ref 78.0–100.0)
Monocytes Absolute: 0.5 10*3/uL (ref 0.1–1.0)
Monocytes Relative: 7 % (ref 3–12)
Neutro Abs: 3.5 10*3/uL (ref 1.7–7.7)
Neutrophils Relative %: 57 % (ref 43–77)
Platelets: 246 10*3/uL (ref 150–400)
RBC: 5.33 MIL/uL — ABNORMAL HIGH (ref 3.87–5.11)
RDW: 12 % (ref 11.5–15.5)
WBC: 6.1 10*3/uL (ref 4.0–10.5)

## 2014-12-03 LAB — URINALYSIS, ROUTINE W REFLEX MICROSCOPIC
Bilirubin Urine: NEGATIVE
Glucose, UA: NEGATIVE mg/dL
Hgb urine dipstick: NEGATIVE
Ketones, ur: NEGATIVE mg/dL
Leukocytes, UA: NEGATIVE
Nitrite: NEGATIVE
Protein, ur: NEGATIVE mg/dL
Specific Gravity, Urine: 1.006 (ref 1.005–1.030)
Urobilinogen, UA: 0.2 mg/dL (ref 0.0–1.0)
pH: 7 (ref 5.0–8.0)

## 2014-12-03 LAB — LIPASE, BLOOD: Lipase: 26 U/L (ref 11–59)

## 2014-12-03 MED ORDER — ONDANSETRON HCL 4 MG/2ML IJ SOLN
4.0000 mg | Freq: Once | INTRAMUSCULAR | Status: DC
  Filled 2014-12-03: qty 2

## 2014-12-03 MED ORDER — SODIUM CHLORIDE 0.9 % IV BOLUS (SEPSIS)
1000.0000 mL | Freq: Once | INTRAVENOUS | Status: AC
  Administered 2014-12-03: 1000 mL via INTRAVENOUS

## 2014-12-03 MED ORDER — LOPERAMIDE HCL 2 MG PO TABS: 2.0000 mg | ORAL_TABLET | Freq: Four times a day (QID) | ORAL | Status: DC | PRN

## 2014-12-03 MED ORDER — IOHEXOL 300 MG/ML  SOLN
50.0000 mL | Freq: Once | INTRAMUSCULAR | Status: AC | PRN
  Administered 2014-12-03: 50 mL via ORAL

## 2014-12-03 MED ORDER — MORPHINE SULFATE 4 MG/ML IJ SOLN
4.0000 mg | Freq: Once | INTRAMUSCULAR | Status: DC
  Filled 2014-12-03: qty 1

## 2014-12-03 MED ORDER — IOHEXOL 300 MG/ML  SOLN
80.0000 mL | Freq: Once | INTRAMUSCULAR | Status: AC | PRN
  Administered 2014-12-03: 80 mL via INTRAVENOUS

## 2014-12-03 NOTE — ED Notes (Signed)
Pt states that over the past month she has been having abd pain with distention and diarrhea.  Pt states that yesterday she had sever Left lateral abd pain with more distention than usual and pain still there this morning. Pt has Celiac's disease but maintains a gluten free diet so not sure what the cause is.

## 2014-12-03 NOTE — Discharge Instructions (Signed)
REturn here as needed. Follow up with your GI doctor.

## 2014-12-03 NOTE — ED Notes (Signed)
POCT Preg Neg

## 2014-12-03 NOTE — ED Provider Notes (Signed)
CSN: [id number]     Arrival date & time 12/03/14  4098 History   First MD Initiated Contact with Patient 12/03/14 [phone]     Chief Complaint  Patient presents with  . Abdominal Pain  . Diarrhea     (Consider location/radiation/quality/duration/timing/severity/associated sxs/prior Treatment) HPI  Patient presents to the emergency department with abdominal pain that has been ongoing over the past month.  Patient is seen by a GI doctor, but has not had any further testing at this time.  The patient states that her abdominal pain seems to be waxing and waning, states it has been worse over the last 2 days.  The patient states that she is not have any relief with the medication she has tried at home.  The patient denies chest pain, shortness of breath, headache, blurred vision, weakness, dizziness, back pain, neck pain, nausea, vomiting, dysuria or incontinence.  Patient states that nothing seems make her condition better or worse. Past Medical History  Diagnosis Date  . Asthma     "mild"  . Celiac disease   . Normal labor 10/19/2013  . Vacuum extractor delivery, delivered (12/26) 10/20/2013   Past Surgical History  Procedure Laterality Date  . Breast enhancement surgery    . Mandible surgery     Family History  Problem Relation Age of Onset  . Diabetes Neg Hx    History  Substance Use Topics  . Smoking status: Never Smoker   . Smokeless tobacco: Not on file  . Alcohol Use: No   OB History    Gravida Para Term Preterm AB TAB SAB Ectopic Multiple Living   0 0 0 0 0 0 1     Review of Systems All other systems negative except as documented in the HPI. All pertinent positives and negatives as reviewed in the HPI.    Allergies  Gluten meal  Home Medications   Prior to Admission medications   Medication Sig Start Date End Date Taking? Authorizing Provider  Iron TABS Take 1 tablet by mouth daily.   Yes Historical Provider, MD  levonorgestrel (MIRENA) 20 MCG/24HR IUD 1  each by Intrauterine route once. Summer time 2015   Yes Historical Provider, MD  Probiotic Product (PROBIOTIC DAILY) CAPS Take 1 capsule by mouth daily.   Yes Historical Provider, MD  amoxicillin-clavulanate (AUGMENTIN) 875-125 MG per tablet Take 1 tablet by mouth 2 (two) times daily. Patient not taking: Reported on 12/03/2014 10/04/14   Collene Gobble, MD  benzonatate (TESSALON) 100 MG capsule Take 1-2 capsules (100-200 mg total) by mouth 3 (three) times daily as needed for cough. Patient not taking: Reported on 12/03/2014 09/28/14   Raelyn Ensign, PA   BP 96/61 mmHg  Pulse 110  Resp 18  SpO2 94%  LMP   Breastfeeding? No Physical Exam  Constitutional: She is oriented to person, place, and time. She appears well-developed and well-nourished. No distress.  HENT:  Head: Normocephalic and atraumatic.  Mouth/Throat: Oropharynx is clear and moist.  Eyes: Pupils are equal, round, and reactive to light.  Neck: Normal range of motion. Neck supple.  Cardiovascular: Normal rate, regular rhythm and normal heart sounds.  Exam reveals no gallop and no friction rub.   No murmur heard. Pulmonary/Chest: Effort normal and breath sounds normal. No respiratory distress.  Abdominal: Soft. Bowel sounds are normal. She exhibits no distension. There is no tenderness.  Neurological: She is alert and oriented to person, place, and time. She exhibits normal muscle tone. Coordination normal.  Skin: Skin is warm and dry. No rash noted. No erythema.  Nursing note and vitals reviewed.   ED Course  Procedures (including critical care time) Labs Review Labs Reviewed  CBC WITH DIFFERENTIAL/PLATELET - Abnormal; Notable for the following:    RBC 5.33 (*)    Hemoglobin 16.2 (*)    All other components within normal limits  URINALYSIS, ROUTINE W REFLEX MICROSCOPIC - Abnormal; Notable for the following:    APPearance CLOUDY (*)    All other components within normal limits  COMPREHENSIVE METABOLIC PANEL  LIPASE, BLOOD   POC URINE PREG, ED    Imaging Review Ct Abdomen Pelvis W Contrast  12/03/2014   CLINICAL DATA:  Abdominal pain, distention and diarrhea for a month. History of celiac disease. Marked increase in left abdominal pain and distention 12/02/2014.  EXAM: CT ABDOMEN AND PELVIS WITH CONTRAST  TECHNIQUE: Multidetector CT imaging of the abdomen and pelvis was performed using the standard protocol following bolus administration of intravenous contrast.  CONTRAST:  50 mL OMNIPAQUE IOHEXOL 300 MG/ML SOLN, 80 mL OMNIPAQUE IOHEXOL 300 MG/ML SOLN  COMPARISON:  CT abdomen and pelvis 01/06/2008.  FINDINGS: The lung bases are clear. No pleural or pericardial effusion. Breast implants are noted.  Multiple loops of small bowel in the left abdomen may have wall thickening although contrast has passed beyond these loops into distal small bowel and colon which appear normal. The appendix is unremarkable.  The gallbladder, liver, spleen, adrenal glands and pancreas appear normal. IUD is in place in the uterus. There is a small volume of free pelvic fluid. No focal bony abnormality is identified.  IMPRESSION: Loops of small bowel in the left abdomen may have thickened walls compatible with enteritis although the appearance could be due to underdistention. The study is otherwise normal.   Electronically Signed   By: Drusilla Kannerhomas  Dalessio M.D.   On: 12/03/2014 11:24    Patient has been stable here in the emergency department.  Patient will have her GI doctor.  Recheck her.  The patient agrees to the plan and all questions were answered  MDM   Final diagnoses:  None       Carlyle DollyChristopher W Cali Hope, PA-C 12/07/14 2112  Nelia Shiobert L Beaton, MD 12/08/14 [phone]

## 2015-08-15 ENCOUNTER — Ambulatory Visit: Payer: Self-pay | Admitting: Allergy and Immunology

## 2015-10-28 ENCOUNTER — Emergency Department (HOSPITAL_COMMUNITY)
Admission: EM | Admit: 2015-10-28 | Discharge: 2015-10-28 | Disposition: A | Discharge status: Home / Self Care | Payer: BLUE CROSS/BLUE SHIELD | Attending: Emergency Medicine | Admitting: Emergency Medicine

## 2015-10-28 ENCOUNTER — Encounter (HOSPITAL_COMMUNITY): Payer: Self-pay | Admitting: Emergency Medicine

## 2015-10-28 DIAGNOSIS — Z79899 Other long term (current) drug therapy: Secondary | ICD-10-CM | POA: Diagnosis not present

## 2015-10-28 DIAGNOSIS — G8929 Other chronic pain: Secondary | ICD-10-CM | POA: Insufficient documentation

## 2015-10-28 DIAGNOSIS — Z3202 Encounter for pregnancy test, result negative: Secondary | ICD-10-CM | POA: Insufficient documentation

## 2015-10-28 DIAGNOSIS — Z8719 Personal history of other diseases of the digestive system: Secondary | ICD-10-CM | POA: Insufficient documentation

## 2015-10-28 DIAGNOSIS — R109 Unspecified abdominal pain: Secondary | ICD-10-CM | POA: Diagnosis not present

## 2015-10-28 DIAGNOSIS — J45909 Unspecified asthma, uncomplicated: Secondary | ICD-10-CM | POA: Diagnosis not present

## 2015-10-28 DIAGNOSIS — R42 Dizziness and giddiness: Secondary | ICD-10-CM

## 2015-10-28 DIAGNOSIS — Z8619 Personal history of other infectious and parasitic diseases: Secondary | ICD-10-CM | POA: Diagnosis not present

## 2015-10-28 DIAGNOSIS — R197 Diarrhea, unspecified: Secondary | ICD-10-CM | POA: Diagnosis not present

## 2015-10-28 LAB — CBC WITH DIFFERENTIAL/PLATELET
Basophils Absolute: 0 10*3/uL (ref 0.0–0.1)
Basophils Relative: 0 %
Eosinophils Absolute: 0 10*3/uL (ref 0.0–0.7)
Eosinophils Relative: 0 %
HCT: 40.7 % (ref 36.0–46.0)
Hemoglobin: 13.9 g/dL (ref 12.0–15.0)
Lymphocytes Relative: 46 %
Lymphs Abs: 2.3 10*3/uL (ref 0.7–4.0)
MCH: 30.8 pg (ref 26.0–34.0)
MCHC: 34.2 g/dL (ref 30.0–36.0)
MCV: 90.2 fL (ref 78.0–100.0)
Monocytes Absolute: 0.2 10*3/uL (ref 0.1–1.0)
Monocytes Relative: 5 %
Neutro Abs: 2.4 10*3/uL (ref 1.7–7.7)
Neutrophils Relative %: 49 %
Platelets: 186 10*3/uL (ref 150–400)
RBC: 4.51 MIL/uL (ref 3.87–5.11)
RDW: 12 % (ref 11.5–15.5)
WBC: 4.9 10*3/uL (ref 4.0–10.5)

## 2015-10-28 LAB — BASIC METABOLIC PANEL
Anion gap: 7 (ref 5–15)
BUN: 9 mg/dL (ref 6–20)
CO2: 29 mmol/L (ref 22–32)
Calcium: 9.4 mg/dL (ref 8.9–10.3)
Chloride: 104 mmol/L (ref 101–111)
Creatinine, Ser: 0.9 mg/dL (ref 0.44–1.00)
GFR calc Af Amer: >60 mL/min (ref >60)
GFR calc non Af Amer: >60 mL/min (ref >60)
Glucose, Bld: 99 mg/dL (ref 65–99)
Potassium: 4.3 mmol/L (ref 3.5–5.1)
Sodium: 140 mmol/L (ref 135–145)

## 2015-10-28 LAB — TSH: TSH: 1.134 u[IU]/mL (ref 0.350–4.500)

## 2015-10-28 LAB — T4, FREE: Free T4: 0.72 ng/dL (ref 0.61–1.12)

## 2015-10-28 LAB — POC URINE PREG, ED: Preg Test, Ur: NEGATIVE

## 2015-10-28 MED ORDER — SODIUM CHLORIDE 0.9 % IV BOLUS (SEPSIS)
500.0000 mL | Freq: Once | INTRAVENOUS | Status: AC
  Administered 2015-10-28: 500 mL via INTRAVENOUS

## 2015-10-28 NOTE — ED Provider Notes (Signed)
CSN: [id number]     Arrival date & time 10/28/15  0920 History   First MD Initiated Contact with Patient 10/28/15 1023     Chief Complaint  Patient presents with  . lyme's disease symptoms      (Consider location/radiation/quality/duration/timing/severity/associated sxs/prior Treatment) HPI Comments: Diagnosed in October with Lyme disease and intestinal parasites, had symptoms for 1 year Stomach can't handle antibiotics so have not been treating Lyme  For last week, feels like there is a delay between reading/seeing and what is registering If sending text or email, misspelling a lot of words This AM feels worse Generalized weakness, feels like "not there" "head swimming" "head fog" "worried me to the point of coming"   Lightheadedness, worse when going from sitting to standing, occasional feeling of being off balance Chronic diarrhea and abd pain  Flagyl with doxycycline has completed 3 rounds 3 rounds of antihelminth medication for tape worm     Past Medical History  Diagnosis Date  . Asthma     "mild"  . Celiac disease   . Normal labor 10/19/2013  . Vacuum extractor delivery, delivered (12/26) 10/20/2013   Past Surgical History  Procedure Laterality Date  . Breast enhancement surgery    . Mandible surgery     Family History  Problem Relation Age of Onset  . Diabetes Neg Hx    Social History  Substance Use Topics  . Smoking status: Never Smoker   . Smokeless tobacco: None  . Alcohol Use: No   OB History    Gravida Para Term Preterm AB TAB SAB Ectopic Multiple Living   1 1 1  0 0 0 0 0 0 1     Review of Systems  Constitutional: Negative for fever.  HENT: Negative for ear pain, sore throat and tinnitus.   Eyes: Negative for visual disturbance.  Respiratory: Negative for cough and shortness of breath.   Cardiovascular: Negative for chest pain.  Gastrointestinal: Positive for abdominal pain (chronic cramping) and diarrhea (chronic). Negative for nausea,  vomiting and constipation.  Genitourinary: Negative for difficulty urinating.  Musculoskeletal: Negative for back pain and neck pain.  Skin: Negative for rash.  Neurological: Positive for light-headedness (when standing up sometimes). Negative for syncope, facial asymmetry, weakness, numbness and headaches.      Allergies  Gluten meal  Home Medications   Prior to Admission medications   Medication Sig Start Date End Date Taking? Authorizing Provider  ALPHA LIPOIC ACID PO Take 1 tablet by mouth 2 (two) times daily.   Yes Historical Provider, MD  Barberry-Oreg Grape-Goldenseal (BERBERINE COMPLEX PO) Take 1 tablet by mouth 2 (two) times daily.   Yes Historical Provider, MD  GRAPE SEED CR PO Take 1 tablet by mouth daily.   Yes Historical Provider, MD  levonorgestrel (MIRENA) 20 MCG/24HR IUD 1 each by Intrauterine route once. Summer time 2015   Yes Historical Provider, MD  loperamide (IMODIUM A-D) 2 MG tablet Take 1 tablet (2 mg total) by mouth 4 (four) times daily as needed for diarrhea or loose stools. 12/03/14  Yes Christopher Lawyer, PA-C  OVER THE COUNTER MEDICATION Take 1 tablet by mouth 2 (two) times daily. [employer] Spiro 1 tablet BID Methylastion complete 1 tablet BID   Yes Historical Provider, MD  praziquantel (BILTRICIDE) 600 MG tablet Take 600 mg by mouth 2 (two) times daily.   Yes Historical Provider, MD  Probiotic Product (PROBIOTIC DAILY) CAPS Take 1 capsule by mouth daily.   Yes Historical Provider, MD  doxycycline (MONODOX) 100  MG capsule Take 200 mg by mouth 2 (two) times daily. Reported on 10/28/2015 10/16/15   Historical Provider, MD  metroNIDAZOLE (FLAGYL) 250 MG tablet Take 500 mg by mouth 3 (three) times daily. Reported on 10/28/2015 10/16/15   Historical Provider, MD   BP 117/75 mmHg  Pulse 68  Temp(Src) 97.8 F (36.6 C) (Oral)  Resp 16  SpO2 100% Physical Exam  Constitutional: She is oriented to person, place, and time. She appears well-developed and well-nourished. No  distress.  HENT:  Head: Normocephalic and atraumatic.  Eyes: Conjunctivae and EOM are normal.  Neck: Normal range of motion.  Cardiovascular: Normal rate, regular rhythm, normal heart sounds and intact distal pulses.  Exam reveals no gallop and no friction rub.   No murmur heard. Pulmonary/Chest: Effort normal and breath sounds normal. No respiratory distress. She has no wheezes. She has no rales.  Abdominal: Soft. She exhibits no distension. There is no tenderness. There is no guarding.  Musculoskeletal: She exhibits no edema or tenderness.  Neurological: She is alert and oriented to person, place, and time. She has normal strength. No cranial nerve deficit or sensory deficit. Coordination and gait normal. GCS eye subscore is 4. GCS verbal subscore is 5. GCS motor subscore is 6.  Skin: Skin is warm and dry. No rash noted. She is not diaphoretic. No erythema.  Nursing note and vitals reviewed.   ED Course  Procedures (including critical care time) Labs Review Labs Reviewed  CBC WITH DIFFERENTIAL/PLATELET  BASIC METABOLIC PANEL  TSH  T4, FREE  POC URINE PREG, ED    Imaging Review No results found. I have personally reviewed and evaluated these images and lab results as part of my medical decision-making.   EKG Interpretation None      MDM   Final diagnoses:  Lightheadedness    30 year old female with history of Giardia and Lyme disease which she reports have been refractory to treatment presents with concern for cognitive slowing today, and continued GI upset in the setting of Giardia and 2 tapeworms.  Patient reports she has been on 3 different rounds of doxycycline and Flagyl for both her Lyme disease and Giardia however continues to have symptoms.   Patient reports sensation of cognitive slowing, however denies any other focal neurologic deficits. She does not exhibit any signs of aphasia or other neurologic abnormalities on exam.  Have low suspicion for CVA, acute lyme  necessitating inpt treatment, or other neurologic emergency.  Given pt concerns, will give Neurology information for follow up.  Abdominal exam benign, no signs significant dehydration to suggest need for inpt giardia or helminth treatment.   Regarding lightheadedness, patient had EKG evaluated by me and shows sinus rhythm with no sign of prolonged QTc, no brugada, no sign of HOCM, no ST abnormalities. Pregnancy test was negative. CBC showed normal hemoglobin. BMP showed normal electrolytes. TSH and free T4 WNL.  Discussed need for PCP follow up, and provided information for patient for Neurology follow up.  Patient discharged in stable condition with understanding of reasons to return.    Jamie Monday, MD 10/28/15 1723

## 2015-10-28 NOTE — ED Notes (Signed)
Pt stated she would give urine sample in 30 min

## 2015-10-28 NOTE — Discharge Instructions (Signed)

## 2015-10-28 NOTE — ED Notes (Signed)
Per pt, states she is having some cognitive issues related to her lyme's disease-states she is having delayed responding-states she is also having issues with her intestinal parasites

## 2016-04-20 ENCOUNTER — Other Ambulatory Visit: Payer: Self-pay | Admitting: Family Medicine

## 2016-04-20 DIAGNOSIS — K589 Irritable bowel syndrome without diarrhea: Secondary | ICD-10-CM

## 2016-04-27 ENCOUNTER — Ambulatory Visit
Admission: RE | Admit: 2016-04-27 | Discharge: 2016-04-27 | Disposition: A | Discharge status: Home / Self Care | Payer: BLUE CROSS/BLUE SHIELD | Source: Ambulatory Visit | Attending: Family Medicine | Admitting: Family Medicine

## 2016-04-27 DIAGNOSIS — K589 Irritable bowel syndrome without diarrhea: Secondary | ICD-10-CM

## 2016-12-17 ENCOUNTER — Encounter: Payer: Self-pay | Admitting: *Deleted

## 2016-12-17 ENCOUNTER — Ambulatory Visit (INDEPENDENT_AMBULATORY_CARE_PROVIDER_SITE_OTHER): Payer: BLUE CROSS/BLUE SHIELD | Admitting: Neurology

## 2016-12-17 ENCOUNTER — Encounter: Payer: Self-pay | Admitting: Neurology

## 2016-12-17 VITALS — BP 114/62 | HR 94 | Ht 63.0 in | Wt 114.2 lb

## 2016-12-17 DIAGNOSIS — R51 Headache: Secondary | ICD-10-CM

## 2016-12-17 DIAGNOSIS — G43109 Migraine with aura, not intractable, without status migrainosus: Secondary | ICD-10-CM

## 2016-12-17 DIAGNOSIS — H539 Unspecified visual disturbance: Secondary | ICD-10-CM | POA: Diagnosis not present

## 2016-12-17 DIAGNOSIS — R519 Headache, unspecified: Secondary | ICD-10-CM

## 2016-12-17 NOTE — Patient Instructions (Signed)
1.  We will get an MRI of the brain with and without contrast to evaluate for causes of your vision problems  2.  Be aware of common food triggers such as processed sweets, processed foods with nitrites (such as deli meat, hot dogs, sausages), foods with MSG, alcohol (such as wine), chocolate, certain cheeses, certain fruits (dried fruits, some citrus fruit), vinegar, diet soda. 4.  Avoid caffeine 5.  Routine exercise 6.  Proper sleep hygiene 7.  Stay adequately hydrated with water 8.  Keep a headache diary. 9.  Maintain proper stress management. 10.  Do not skip meals. 11.  Consider supplements:  Magnesium citrate 400mg  to 600mg  daily, riboflavin 400mg , Coenzyme Q 10 100mg  three times daily 12.  If MRI is unremarkable, we can do the following:  1.  Start a migraine prevention, such as:  topiramate (Topamax), nortriptyline, propranolol ER.  I would probably choose Topamax or nortriptyline.   2.  If the constant visual symptoms do not improve (with or without medication), I can refer you to neuro-ophthalmology, Dr[facility] G[facility]othy Martin[facility]. 13.  Will contact you with MRI results.  If you wish to start a medication, I will have you follow up with me.  Otherwise, follow up as needed (if symptoms are worse or if you would like to start medication).

## 2016-12-17 NOTE — Progress Notes (Signed)
NEUROLOGY CONSULTATION NOTE  [name] MRN: [id number] DOB: [birth year]  Referring provider: Milus HeightNoelle Redmon, PA-C Primary care provider: Milus HeightNoelle Redmon, PA-C  Reason for consult:  Ocular migraines  HISTORY OF PRESENT ILLNESS: [name] is a 31 year old right-handed female with ADD, depression, migraine, Celiac disease, and Lyme disease who presents for ocular migraines.  History supplemented by PCP note.  Opthalmologic exam findings reviewed from ophthalmology note.  Around October 2017, she began experiencing visual disturbance.  She reports fleeting episodes of left sided flashing/lightening-like dull 2/10 forehead pain associated with bright light in vision of both eyes, lasting just seconds.  Initially, they occurred once in a while but steadily increased to several times a day, about 3 to 4 days a week.  There is no associated nausea, vomiting, photophobia, phonophobia, osmophobia, dizziness or unilateral numbness or weakness.  She also reports constant visual symptoms as well.  She sees floaters.  She feels that her vision is worse.  Her night driving has decreased and often sees halos around bright objects.  She also has reports "visual processing" deficits when she looks at certain objects that separated in half.  For example, there is a superhero in one of her son's books in which one half of his face is normal and the other half is a monster.  Whenever she looks at it, she has trouble processing it and has an uncomfortable feeling.  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  She was diagnosed with ophthalmic migraines.  She reports nothing that would have precipitated these symptoms (no new medication or change in environment).  Although she reports increased stress over the past several months due to her job.  She sleeps well.  She exercises and  hydrates.  She was diagnosed with Lyme disease in 2017 after experiencing 2 years of feeling ill and fatigued with frequent viral illnesses and gastrointestinal upset.  She was treated with several rounds of antibiotics and is taking multiple supplements.  She still reports "brain fog" and fatigue.  She does have remote history of migraines, described as pounding and associated with photophobia and phonophobia.  They were hormonal.  The were also treated with Imitrex.  They resolved in her mid 7020s.  She had Lasik surgery in 2012.  PAST MEDICAL HISTORY: Past Medical History:  Diagnosis Date  . Abnormal heart rhythm   . ADD (attention deficit disorder)   . Asthma    "mild"  . Celiac disease   . Migraine   . Normal labor 10/19/2013  . Vacuum extractor delivery, delivered (12/26) 10/20/2013    PAST SURGICAL HISTORY: Past Surgical History:  Procedure Laterality Date  . BREAST ENHANCEMENT SURGERY    . MANDIBLE SURGERY      MEDICATIONS: Current Outpatient Prescriptions on File Prior to Visit  Medication Sig Dispense Refill  . ALPHA LIPOIC ACID PO Take 1 tablet by mouth 2 (two) times daily.    [name]. Barberry-Oreg Grape-Goldenseal (BERBERINE COMPLEX PO) Take 1 tablet by mouth 2 (two) times daily.    Marland Kitchen. levonorgestrel (MIRENA) 20 MCG/24HR IUD 1 each by Intrauterine route once. Summer time 2015     No current facility-administered medications on file prior to visit.     ALLERGIES: Allergies  Allergen Reactions  . Gluten Meal Other (See Comments)    Celiac disease    FAMILY HISTORY: Family History  Problem  Relation Age of Onset  . Diabetes Neg Hx     SOCIAL HISTORY: Social History   Social History  . Marital status: Married    Spouse name: N/A  . Number of children: 1  . Years of education: N/A   Occupational History  . HR manager    Social History Main Topics  . Smoking status: Never Smoker  . Smokeless tobacco: Never Used  . Alcohol use Yes  . Drug use: No  .  Sexual activity: Yes   Other Topics Concern  . Not on file   Social History Narrative  . No narrative on file    REVIEW OF SYSTEMS: Constitutional: No fevers, chills, or sweats, no generalized fatigue, change in appetite Eyes: No visual changes, double vision, eye pain Ear, nose and throat: No hearing loss, ear pain, nasal congestion, sore throat Cardiovascular: No chest pain, palpitations Respiratory:  No shortness of breath at rest or with exertion, wheezes GastrointestinaI: No nausea, vomiting, diarrhea, abdominal pain, fecal incontinence Genitourinary:  No dysuria, urinary retention or frequency Musculoskeletal:  No neck pain, back pain Integumentary: No rash, pruritus, skin lesions Neurological: as above Psychiatric: No depression, insomnia, anxiety Endocrine: No palpitations, fatigue, diaphoresis, mood swings, change in appetite, change in weight, increased thirst Hematologic/Lymphatic:  No purpura, petechiae. Allergic/Immunologic: no itchy/runny eyes, nasal congestion, recent allergic reactions, rashes  PHYSICAL EXAM: Vitals:   12/17/16 0858  BP: 114/62  Pulse: 94   General: No acute distress.  Patient appears well-groomed.  Head:  Normocephalic/atraumatic Eyes:  fundi examined but not visualized Neck: supple, no paraspinal tenderness, full range of motion Back: No paraspinal tenderness Heart: regular rate and rhythm Lungs: Clear to auscultation bilaterally. Vascular: No carotid bruits. Neurological Exam: Mental status: alert and oriented to person, place, and time, recent and remote memory intact, fund of knowledge intact, attention and concentration intact, speech fluent and not dysarthric, language intact. Cranial nerves: CN I: not tested CN II: pupils equal, round and reactive to light, visual fields grossly intact but had difficulty seeing my fingers in her periphery, maybe mostly on the left in both eyes, but sometimes on the right side, both inconsistent. CN  III, IV, VI:  full range of motion, no nystagmus, no ptosis CN V: facial sensation intact CN VII: upper and lower face symmetric CN VIII: hearing intact CN IX, X: gag intact, uvula midline CN XI: sternocleidomastoid and trapezius muscles intact CN XII: tongue midline Bulk & Tone: normal, no fasciculations. Motor:  5/5 throughout  Sensation: temperature and vibration sensation intact. Deep Tendon Reflexes:  2+ throughout, toes downgoing.  Finger to nose testing:  Without dysmetria.  Heel to shin:  Without dysmetria.  Gait:  Normal station and stride.  Able to turn and tandem walk. Romberg negative.  IMPRESSION: 1.  Increased frequency of episodic visual disturbance with headache likely ocular migraines 2.  Ongoing visual disturbance, described as floaters, decreased night vision and visuospatial perception and processing deficits.  Unclear etiology.  Not sure if it is a residual symptoms of frequent ocular migraines, but typically migraine shouldn't be associated with ongoing constant visual symptoms.  Routine ophthalmologic exam was unremarkable (although it mentioned slight visual disturbance in the superior temporal aspect of her left eye.  No explanation was given.  On my gross exam, findings were inconsistent).  PLAN: 1.  With her ongoing visual disturbance and increased frequency of headache, I believe she needs MRI of brain with and without contrast to rule out intracranial abnormality. 2.  Assuming  these are ocular migraines, we discussed preventative medications, particularly nortriptyline and topiramate.  We reviewed side effects.  We also discussed non-pharmacologic treatment for migraines.  At this time, she wishes to hold off on starting a medication until MRI results and will try non-pharmacologic management and try to figure out possible triggers.  She says the ocular migraines are manageable and she is most interested in trying to find out a cause of these symptoms.  If she wishes  to start a preventative and visual symptoms don't resolve, I can refer her to Dr. Gentry Roch, neuro-ophthalmologist at [facility]. 3.  If she starts a preventative, I will see her back.  If not, I told her to follow up with me if symptoms get worse or she wishes to start a preventative.  Thank you for allowing me to take part in the care of this patient.  Shon Millet, DO  CC:  Milus Height, PA-C

## 2016-12-25 ENCOUNTER — Other Ambulatory Visit: Payer: Self-pay | Admitting: Physician Assistant

## 2016-12-25 ENCOUNTER — Ambulatory Visit
Admission: RE | Admit: 2016-12-25 | Discharge: 2016-12-25 | Disposition: A | Discharge status: Home / Self Care | Payer: BLUE CROSS/BLUE SHIELD | Source: Ambulatory Visit | Attending: Neurology | Admitting: Neurology

## 2016-12-25 DIAGNOSIS — H539 Unspecified visual disturbance: Secondary | ICD-10-CM

## 2016-12-25 DIAGNOSIS — G43109 Migraine with aura, not intractable, without status migrainosus: Secondary | ICD-10-CM

## 2016-12-25 DIAGNOSIS — R51 Headache: Secondary | ICD-10-CM

## 2016-12-25 DIAGNOSIS — R109 Unspecified abdominal pain: Secondary | ICD-10-CM

## 2016-12-25 DIAGNOSIS — R519 Headache, unspecified: Secondary | ICD-10-CM

## 2016-12-25 MED ORDER — GADOBENATE DIMEGLUMINE 529 MG/ML IV SOLN
10.0000 mL | Freq: Once | INTRAVENOUS | Status: AC | PRN
  Administered 2016-12-25: 10 mL via INTRAVENOUS

## 2016-12-28 ENCOUNTER — Telehealth: Payer: Self-pay

## 2016-12-28 NOTE — Telephone Encounter (Signed)
Message relayed to patient via vm.

## 2016-12-28 NOTE — Telephone Encounter (Signed)
-----   Message from Drema DallasAdam R Jaffe, DO sent at 12/28/2016  8:18 AM EST ----- MRI of brain is normal.  I think she has experienced ocular migraines.  I do not have an explanation for the continued persistent visual symptoms she described.  If she wishes, we can  refer her to another ophthalmologist for a second opinion.

## 2017-01-01 ENCOUNTER — Other Ambulatory Visit: Payer: BLUE CROSS/BLUE SHIELD

## 2017-01-19 ENCOUNTER — Ambulatory Visit
Admission: RE | Admit: 2017-01-19 | Discharge: 2017-01-19 | Disposition: A | Discharge status: Home / Self Care | Payer: BLUE CROSS/BLUE SHIELD | Source: Ambulatory Visit | Attending: Physician Assistant | Admitting: Physician Assistant

## 2017-01-19 DIAGNOSIS — R109 Unspecified abdominal pain: Secondary | ICD-10-CM

## 2017-01-19 MED ORDER — IOPAMIDOL (ISOVUE-300) INJECTION 61%
100.0000 mL | Freq: Once | INTRAVENOUS | Status: AC | PRN
  Administered 2017-01-19: 100 mL via INTRAVENOUS

## 2017-02-08 ENCOUNTER — Ambulatory Visit: Payer: BLUE CROSS/BLUE SHIELD | Admitting: Neurology

## 2017-06-20 IMAGING — CT CT ABD-PELV W/ CM
1 of 2 series · 15 of 32 positions shown, 19 images · IV contrast (APPLIED)
Comparison: 12/03/2014

CLINICAL DATA: Abdominal pain and bloating

EXAM:
CT ABDOMEN AND PELVIS WITH CONTRAST
TECHNIQUE: Multidetector CT imaging of the abdomen and pelvis was performed
using the standard protocol following bolus administration of
intravenous contrast.
CONTRAST:  100mL [id number] IOPAMIDOL ([id number]) INJECTION 61%

[Series 2: abd/pelvis w/cm · axial · 0.60mm/px · z∈[-484,-59]mm · 15 of 93 slices shown, 19 images]
[im 4/93  soft-tissue]
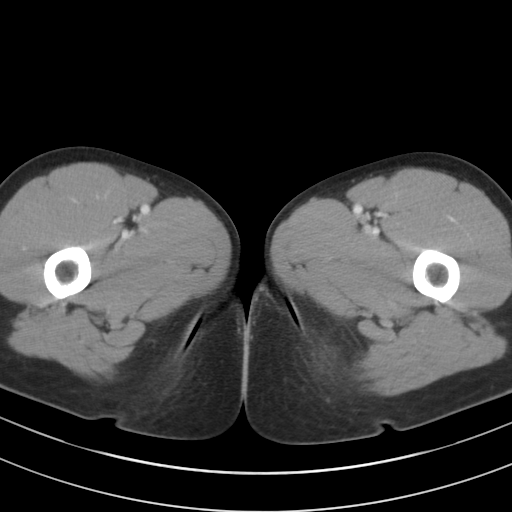
[im 4/93  bone]
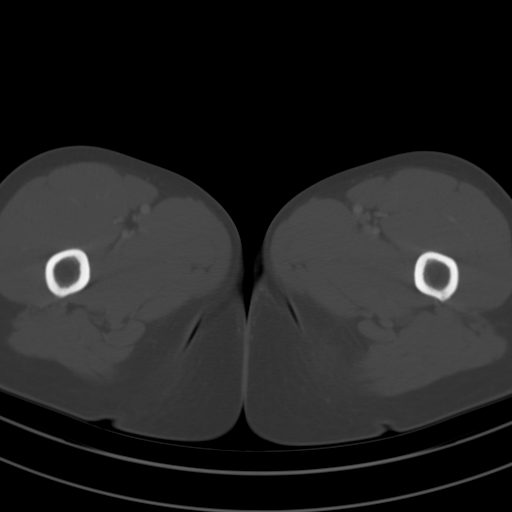
[im 11/93  soft-tissue]
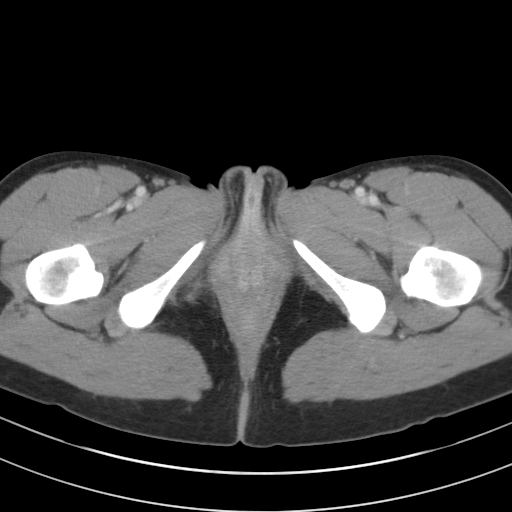
[im 18/93  soft-tissue]
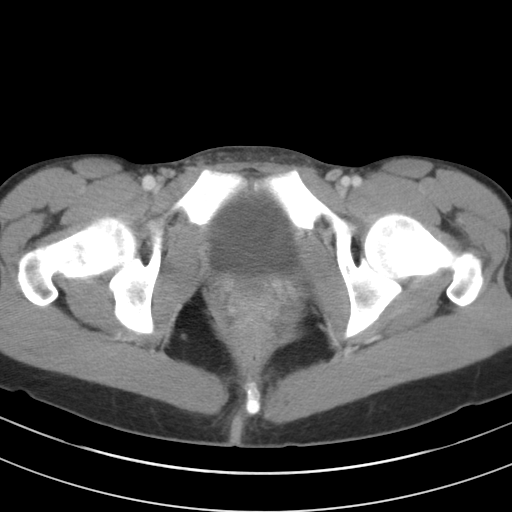
[im 25/93  soft-tissue]
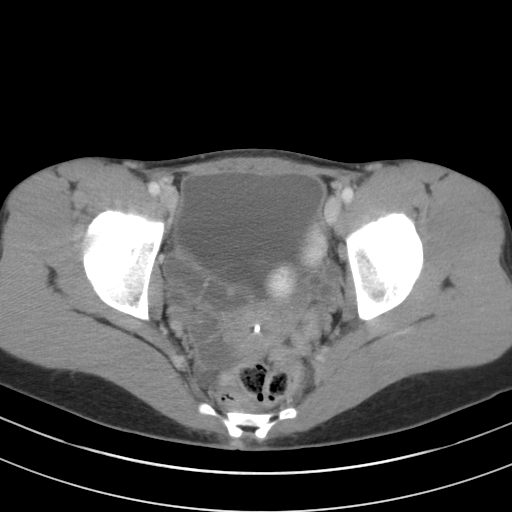
[im 32/93  soft-tissue]
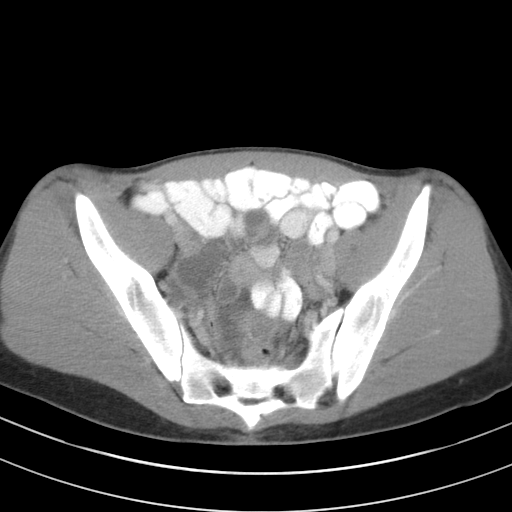
[im 39/93  soft-tissue]
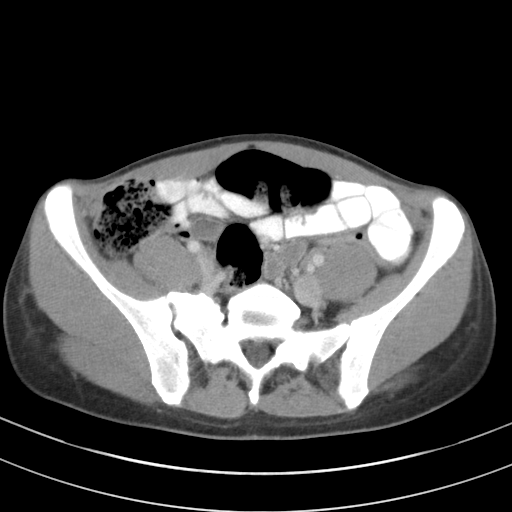
[im 47/93  soft-tissue]
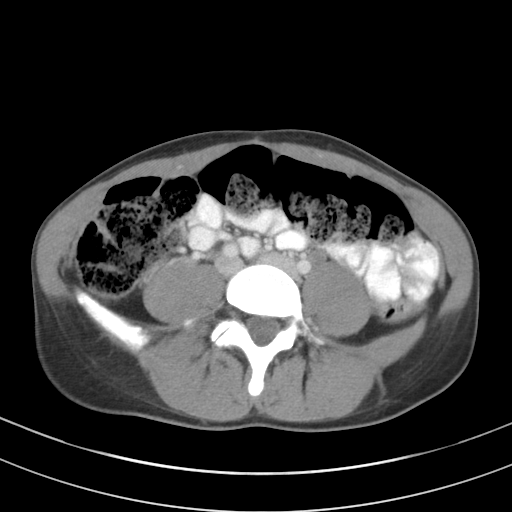
[im 54/93  soft-tissue]
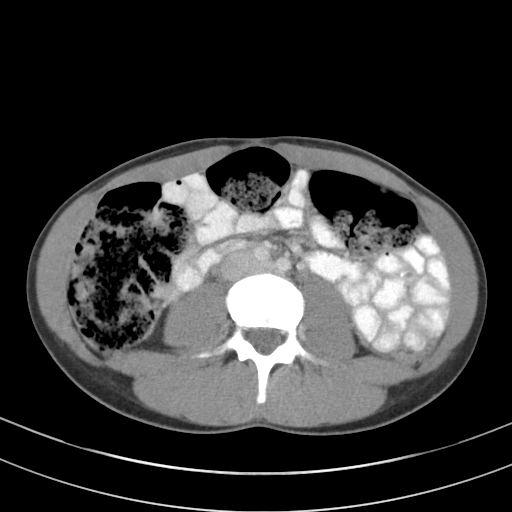
[im 61/93  soft-tissue]
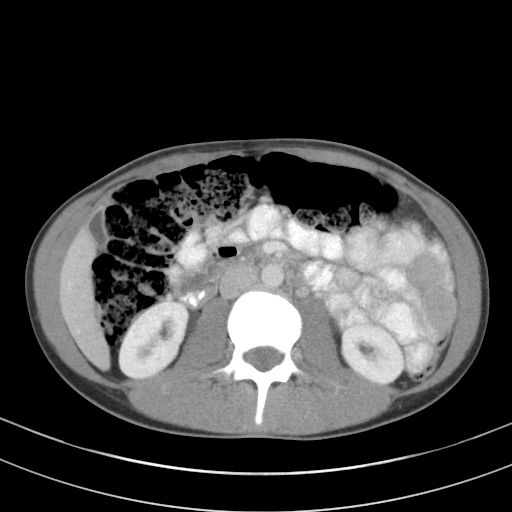
[im 61/93  bone]
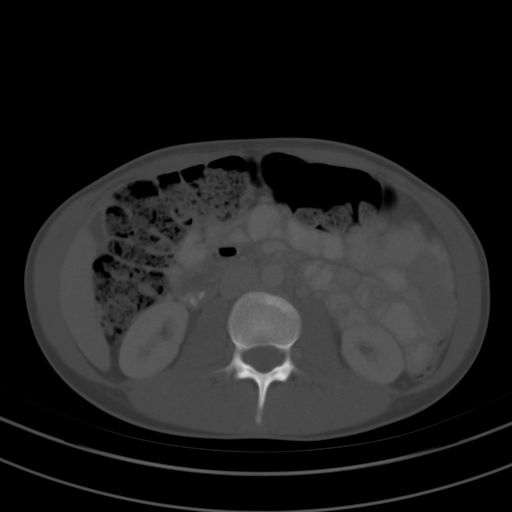
[im 68/93  soft-tissue]
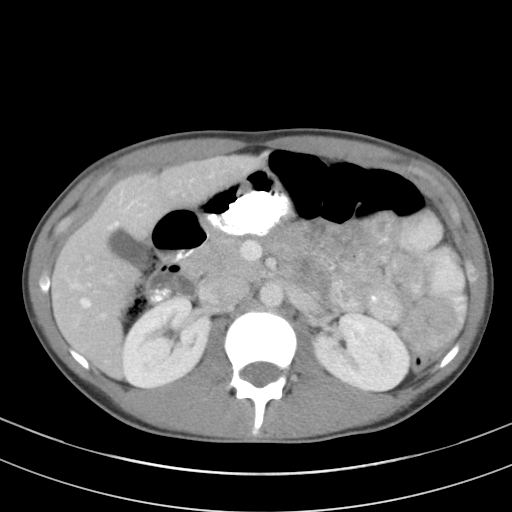
[im 75/93  soft-tissue]
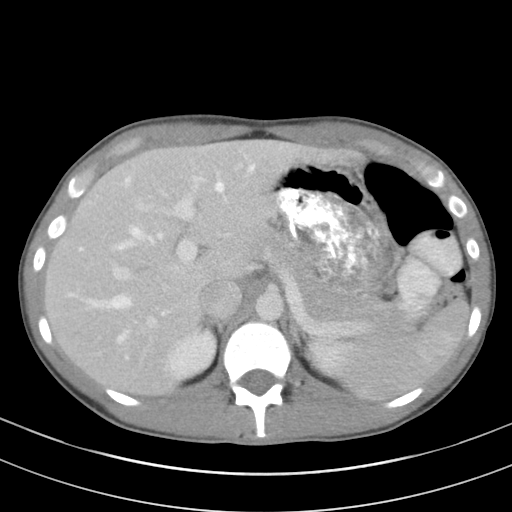
[im 78/93  lung]
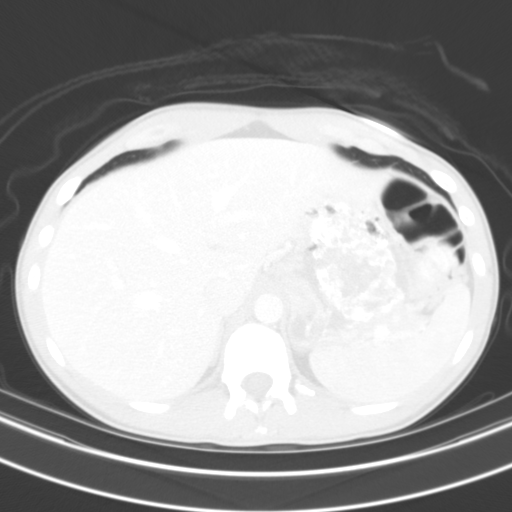
[im 82/93  soft-tissue]
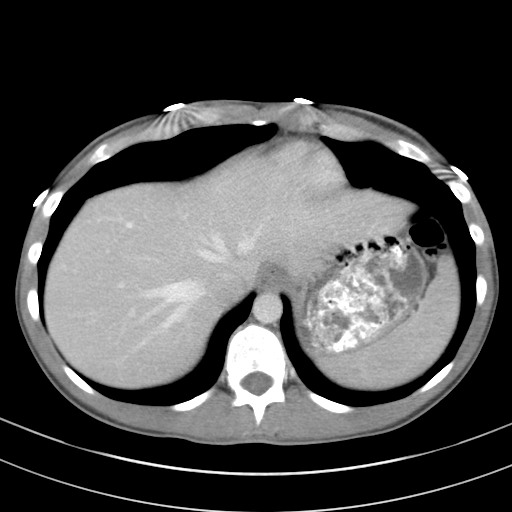
[im 82/93  lung]
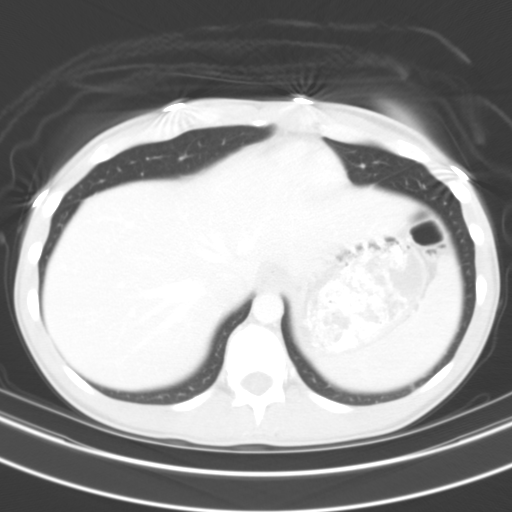
[im 85/93  lung]
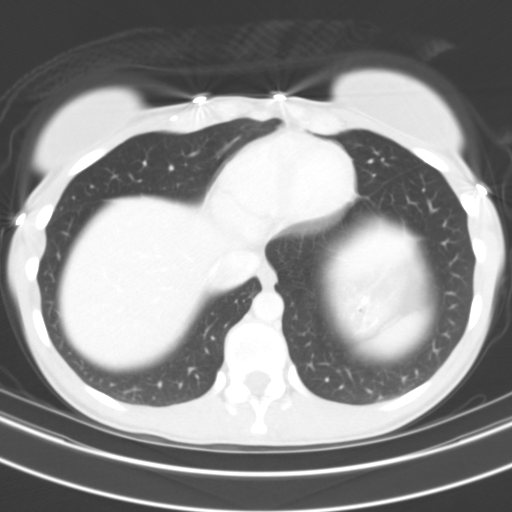
[im 89/93  soft-tissue]
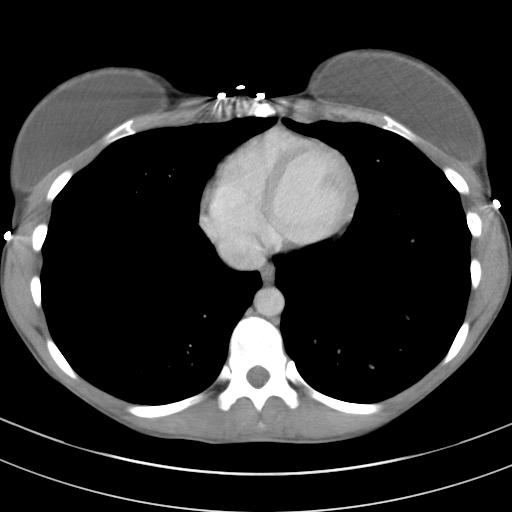
[im 89/93  lung]
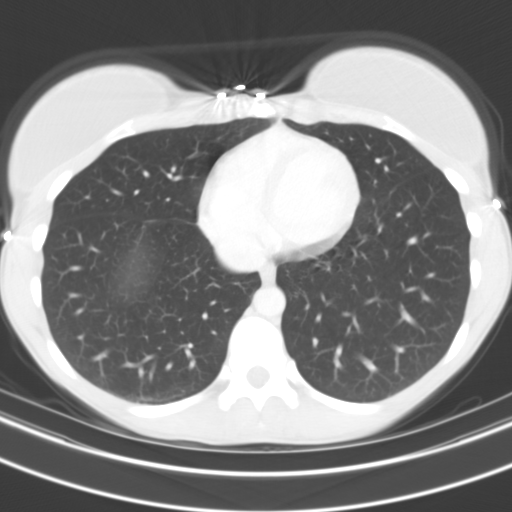

[15 of 32 positions shown; findings below may reference images not displayed]

FINDINGS: Lower chest: Lung bases are free of acute infiltrate or sizable
effusion. Bilateral breast implants are noted.

Hepatobiliary: No focal liver abnormality is seen. No gallstones,
gallbladder wall thickening, or biliary dilatation.

Pancreas: Unremarkable. No pancreatic ductal dilatation or
surrounding inflammatory changes.

Spleen: Normal in size without focal abnormality.

Adrenals/Urinary Tract: Adrenal glands are unremarkable. Kidneys are
normal, without renal calculi, focal lesion, or hydronephrosis.
Bladder is unremarkable.

Stomach/Bowel: No obstructive or inflammatory changes are
identified. The appendix is air-filled and within normal limits.

Vascular/Lymphatic: No significant vascular findings are present. No
enlarged abdominal or pelvic lymph nodes.

Reproductive: An IUD is noted within the uterus. Bilateral small
follicular changes are noted bilaterally.

Other: Minimal free pelvic fluid is noted likely physiologic in
nature.

Musculoskeletal: No acute or significant osseous findings.
IMPRESSION: No acute abnormality noted.

## 2018-09-05 LAB — HIV ANTIBODY (ROUTINE TESTING W REFLEX): HIV Screen 4th Generation wRfx: NONREACTIVE

## 2018-09-13 LAB — OB RESULTS CONSOLE GC/CHLAMYDIA
Chlamydia: NEGATIVE
Gonorrhea: NEGATIVE

## 2018-09-13 LAB — OB RESULTS CONSOLE HEPATITIS B SURFACE ANTIGEN: Hepatitis B Surface Ag: NEGATIVE

## 2018-09-13 LAB — OB RESULTS CONSOLE HIV ANTIBODY (ROUTINE TESTING): HIV: NONREACTIVE

## 2018-09-13 LAB — OB RESULTS CONSOLE RUBELLA ANTIBODY, IGM: Rubella: IMMUNE

## 2018-10-26 NOTE — L&D Delivery Note (Signed)
Delivery Note At 6:40 PM a viable and healthy female was delivered via Vaginal, Spontaneous (Presentation: OA ).  APGAR: 8, 9; weight  pending.   Placenta status: spontaneous, complete .  Cord:  with the following complications: true knot in the cord, but loose .  Cord pH: n//a Episiotomy made due to tight introitus with terminal decel needing immediate delivery as baby was crowning but perineum didn't have spontaneous tear.   Anesthesia:  epidural Episiotomy: Left Mediolateral Lacerations:  None other Suture Repair: 3.0 vicryl rapide Est. Blood Loss (mL): 150 cc  Mom to postpartum.  Baby to Couplet care / Skin to Skin.  Elveria Royals 04/17/2019, 7:08 PM

## 2019-03-28 LAB — OB RESULTS CONSOLE GBS: GBS: NEGATIVE

## 2019-04-17 ENCOUNTER — Inpatient Hospital Stay (HOSPITAL_COMMUNITY): Payer: BC Managed Care – PPO | Admitting: Anesthesiology

## 2019-04-17 ENCOUNTER — Encounter (HOSPITAL_COMMUNITY): Payer: Self-pay | Admitting: *Deleted

## 2019-04-17 ENCOUNTER — Other Ambulatory Visit: Payer: Self-pay

## 2019-04-17 ENCOUNTER — Inpatient Hospital Stay (HOSPITAL_COMMUNITY)
Admission: AD | Admit: 2019-04-17 | Discharge: 2019-04-19 | DRG: 807 | Disposition: A | Discharge status: Home / Self Care | Payer: BC Managed Care – PPO | Attending: Obstetrics & Gynecology | Admitting: Obstetrics & Gynecology

## 2019-04-17 DIAGNOSIS — Z3A39 39 weeks gestation of pregnancy: Secondary | ICD-10-CM

## 2019-04-17 DIAGNOSIS — O26893 Other specified pregnancy related conditions, third trimester: Secondary | ICD-10-CM | POA: Diagnosis present

## 2019-04-17 DIAGNOSIS — Z1159 Encounter for screening for other viral diseases: Secondary | ICD-10-CM

## 2019-04-17 DIAGNOSIS — O99344 Other mental disorders complicating childbirth: Secondary | ICD-10-CM | POA: Diagnosis present

## 2019-04-17 DIAGNOSIS — F329 Major depressive disorder, single episode, unspecified: Secondary | ICD-10-CM | POA: Diagnosis present

## 2019-04-17 DIAGNOSIS — Z6791 Unspecified blood type, Rh negative: Secondary | ICD-10-CM

## 2019-04-17 DIAGNOSIS — Z3490 Encounter for supervision of normal pregnancy, unspecified, unspecified trimester: Secondary | ICD-10-CM

## 2019-04-17 HISTORY — DX: Depression, unspecified: F32.A

## 2019-04-17 LAB — SARS CORONAVIRUS 2 BY RT PCR (HOSPITAL ORDER, PERFORMED IN ~~LOC~~ HOSPITAL LAB): SARS Coronavirus 2: NEGATIVE

## 2019-04-17 LAB — CBC
HCT: 39.2 % (ref 36.0–46.0)
Hemoglobin: 13.6 g/dL (ref 12.0–15.0)
MCH: 31.8 pg (ref 26.0–34.0)
MCHC: 34.7 g/dL (ref 30.0–36.0)
MCV: 91.6 fL (ref 80.0–100.0)
Platelets: 199 10*3/uL (ref 150–400)
RBC: 4.28 MIL/uL (ref 3.87–5.11)
RDW: 12.9 % (ref 11.5–15.5)
WBC: 12.6 10*3/uL — ABNORMAL HIGH (ref 4.0–10.5)
nRBC: 0 % (ref 0.0–0.2)

## 2019-04-17 LAB — OB RESULTS CONSOLE RPR: RPR: NONREACTIVE

## 2019-04-17 MED ORDER — ACETAMINOPHEN 325 MG PO TABS: 650.0000 mg | ORAL_TABLET | ORAL | Status: DC | PRN

## 2019-04-17 MED ORDER — DIPHENHYDRAMINE HCL 25 MG PO CAPS: 25.0000 mg | ORAL_CAPSULE | Freq: Four times a day (QID) | ORAL | Status: DC | PRN

## 2019-04-17 MED ORDER — LACTATED RINGERS IV SOLN
INTRAVENOUS | Status: DC
  Administered 2019-04-17 (×2): via INTRAVENOUS

## 2019-04-17 MED ORDER — TETANUS-DIPHTH-ACELL PERTUSSIS 5-2.5-18.5 LF-MCG/0.5 IM SUSP: 0.5000 mL | Freq: Once | INTRAMUSCULAR | Status: DC

## 2019-04-17 MED ORDER — BENZOCAINE-MENTHOL 20-0.5 % EX AERO
1.0000 "application " | INHALATION_SPRAY | CUTANEOUS | Status: DC | PRN
  Administered 2019-04-18: 1 via TOPICAL
  Filled 2019-04-17: qty 56

## 2019-04-17 MED ORDER — OXYTOCIN 40 UNITS IN NORMAL SALINE INFUSION - SIMPLE MED
1.0000 m[IU]/min | INTRAVENOUS | Status: DC
  Administered 2019-04-17: 2 m[IU]/min via INTRAVENOUS
  Filled 2019-04-17: qty 1000

## 2019-04-17 MED ORDER — WITCH HAZEL-GLYCERIN EX PADS: 1.0000 "application " | MEDICATED_PAD | CUTANEOUS | Status: DC | PRN

## 2019-04-17 MED ORDER — LIDOCAINE HCL (PF) 1 % IJ SOLN
INTRAMUSCULAR | Status: DC | PRN
  Administered 2019-04-17: 7 mL via EPIDURAL
  Administered 2019-04-17: 5 mL via EPIDURAL

## 2019-04-17 MED ORDER — DIBUCAINE (PERIANAL) 1 % EX OINT: 1.0000 "application " | TOPICAL_OINTMENT | CUTANEOUS | Status: DC | PRN

## 2019-04-17 MED ORDER — TERBUTALINE SULFATE 1 MG/ML IJ SOLN
0.2500 mg | Freq: Once | INTRAMUSCULAR | Status: AC | PRN
  Administered 2019-04-17: 0.25 mg via SUBCUTANEOUS
  Filled 2019-04-17: qty 1

## 2019-04-17 MED ORDER — OXYCODONE-ACETAMINOPHEN 5-325 MG PO TABS: 1.0000 | ORAL_TABLET | ORAL | Status: DC | PRN

## 2019-04-17 MED ORDER — OXYCODONE-ACETAMINOPHEN 5-325 MG PO TABS: 2.0000 | ORAL_TABLET | ORAL | Status: DC | PRN

## 2019-04-17 MED ORDER — PHENYLEPHRINE 40 MCG/ML (10ML) SYRINGE FOR IV PUSH (FOR BLOOD PRESSURE SUPPORT): 80.0000 ug | PREFILLED_SYRINGE | INTRAVENOUS | Status: DC | PRN

## 2019-04-17 MED ORDER — SODIUM CHLORIDE (PF) 0.9 % IJ SOLN
INTRAMUSCULAR | Status: DC | PRN
  Administered 2019-04-17: 12 mL/h via EPIDURAL

## 2019-04-17 MED ORDER — EPHEDRINE 5 MG/ML INJ
10.0000 mg | INTRAVENOUS | Status: DC | PRN
  Filled 2019-04-17: qty 10

## 2019-04-17 MED ORDER — ZOLPIDEM TARTRATE 5 MG PO TABS: 5.0000 mg | ORAL_TABLET | Freq: Every evening | ORAL | Status: DC | PRN

## 2019-04-17 MED ORDER — EPHEDRINE 5 MG/ML INJ
10.0000 mg | INTRAVENOUS | Status: DC | PRN
  Administered 2019-04-17: 10 mg via INTRAVENOUS

## 2019-04-17 MED ORDER — PRENATAL MULTIVITAMIN CH
1.0000 | ORAL_TABLET | Freq: Every day | ORAL | Status: DC
  Filled 2019-04-17: qty 1

## 2019-04-17 MED ORDER — COCONUT OIL OIL: 1.0000 "application " | TOPICAL_OIL | Status: DC | PRN

## 2019-04-17 MED ORDER — LACTATED RINGERS IV SOLN
500.0000 mL | Freq: Once | INTRAVENOUS | Status: AC
  Administered 2019-04-17: 500 mL via INTRAVENOUS

## 2019-04-17 MED ORDER — OXYTOCIN BOLUS FROM INFUSION
500.0000 mL | Freq: Once | INTRAVENOUS | Status: AC
  Administered 2019-04-17: 500 mL via INTRAVENOUS

## 2019-04-17 MED ORDER — LIDOCAINE HCL (PF) 1 % IJ SOLN: 30.0000 mL | INTRAMUSCULAR | Status: DC | PRN

## 2019-04-17 MED ORDER — ONDANSETRON HCL 4 MG/2ML IJ SOLN: 4.0000 mg | Freq: Four times a day (QID) | INTRAMUSCULAR | Status: DC | PRN

## 2019-04-17 MED ORDER — SENNOSIDES-DOCUSATE SODIUM 8.6-50 MG PO TABS
2.0000 | ORAL_TABLET | ORAL | Status: DC
  Administered 2019-04-17 – 2019-04-19 (×2): 2 via ORAL
  Filled 2019-04-17 (×2): qty 2

## 2019-04-17 MED ORDER — BUPROPION HCL ER (XL) 150 MG PO TB24
150.0000 mg | ORAL_TABLET | Freq: Every day | ORAL | Status: DC
  Filled 2019-04-17 (×2): qty 1

## 2019-04-17 MED ORDER — ONDANSETRON HCL 4 MG/2ML IJ SOLN: 4.0000 mg | INTRAMUSCULAR | Status: DC | PRN

## 2019-04-17 MED ORDER — IBUPROFEN 600 MG PO TABS
600.0000 mg | ORAL_TABLET | Freq: Four times a day (QID) | ORAL | Status: DC
  Administered 2019-04-17 – 2019-04-19 (×7): 600 mg via ORAL
  Filled 2019-04-17 (×7): qty 1

## 2019-04-17 MED ORDER — SOD CITRATE-CITRIC ACID 500-334 MG/5ML PO SOLN: 30.0000 mL | ORAL | Status: DC | PRN

## 2019-04-17 MED ORDER — LACTATED RINGERS IV SOLN: 500.0000 mL | INTRAVENOUS | Status: DC | PRN

## 2019-04-17 MED ORDER — DIPHENHYDRAMINE HCL 50 MG/ML IJ SOLN: 12.5000 mg | INTRAMUSCULAR | Status: DC | PRN

## 2019-04-17 MED ORDER — FENTANYL-BUPIVACAINE-NACL 0.5-0.125-0.9 MG/250ML-% EP SOLN
12.0000 mL/h | EPIDURAL | Status: DC | PRN
  Filled 2019-04-17: qty 250

## 2019-04-17 MED ORDER — ACETAMINOPHEN 325 MG PO TABS
650.0000 mg | ORAL_TABLET | ORAL | Status: DC | PRN
  Administered 2019-04-18 (×3): 650 mg via ORAL
  Filled 2019-04-17 (×3): qty 2

## 2019-04-17 MED ORDER — ONDANSETRON HCL 4 MG PO TABS: 4.0000 mg | ORAL_TABLET | ORAL | Status: DC | PRN

## 2019-04-17 MED ORDER — OXYTOCIN 40 UNITS IN NORMAL SALINE INFUSION - SIMPLE MED
2.5000 [IU]/h | INTRAVENOUS | Status: DC
  Administered 2019-04-17: 2.5 [IU]/h via INTRAVENOUS

## 2019-04-17 MED ORDER — SIMETHICONE 80 MG PO CHEW
80.0000 mg | CHEWABLE_TABLET | ORAL | Status: DC | PRN
  Administered 2019-04-18 (×2): 80 mg via ORAL
  Filled 2019-04-17 (×2): qty 1

## 2019-04-17 NOTE — Anesthesia Preprocedure Evaluation (Signed)
Anesthesia Evaluation  Patient identified by MRN, date of birth, ID band Patient awake    Reviewed: Allergy & Precautions, H&P , NPO status , Patient's Chart, lab work & pertinent test results  Airway Mallampati: I  TM Distance: >3 FB Neck ROM: full    Dental no notable dental hx. (+) Teeth Intact   Pulmonary    Pulmonary exam normal breath sounds clear to auscultation       Cardiovascular negative cardio ROS Normal cardiovascular exam Rhythm:Regular Rate:Normal     Neuro/Psych PSYCHIATRIC DISORDERS Depression negative neurological ROS     GI/Hepatic negative GI ROS, Neg liver ROS,   Endo/Other  negative endocrine ROS  Renal/GU negative Renal ROS  negative genitourinary   Musculoskeletal negative musculoskeletal ROS (+)   Abdominal Normal abdominal exam  (+)   Peds  Hematology negative hematology ROS (+)   Anesthesia Other Findings   Reproductive/Obstetrics (+) Pregnancy                             Anesthesia Physical  Anesthesia Plan  ASA: II  Anesthesia Plan: Epidural   Post-op Pain Management:    Induction:   PONV Risk Score and Plan:   Airway Management Planned:   Additional Equipment:   Intra-op Plan:   Post-operative Plan:   Informed Consent: I have reviewed the patients History and Physical, chart, labs and discussed the procedure including the risks, benefits and alternatives for the proposed anesthesia with the patient or authorized representative who has indicated his/her understanding and acceptance.       Plan Discussed with:   Anesthesia Plan Comments:         Anesthesia Quick Evaluation

## 2019-04-17 NOTE — MAU Note (Signed)
Asymptomatic, swab collected. 

## 2019-04-17 NOTE — H&P (Signed)
Devony Mcgrady is a 33 y.o. female presenting for IOL at 39.5 wks. AMA.  1st trim subchorionic hemorrhage, overall uncomplicated pregnancy, 40 lbs wt gain (was 50 in last preg) Rh neg, rhogam in 1st trim (for bleeding) and at 28 wks.  Migraine, ADD, Depression, Celiac disease, Lyme's disease hx.   OB History    Gravida  2   Para  1   Term  1   Preterm  0   AB  0   Living  1     SAB  0   TAB  0   Ectopic  0   Multiple  0   Live Births  1          Past Medical History:  Diagnosis Date  . Abnormal heart rhythm   . ADD (attention deficit disorder)   . Asthma    "mild"  . Celiac disease   . Depression    on Wellbutrin  . Migraine   . Normal labor 10/19/2013  . Vacuum extractor delivery, delivered (12/26) 10/20/2013   Past Surgical History:  Procedure Laterality Date  . BREAST ENHANCEMENT SURGERY    . MANDIBLE SURGERY     Family History: family history is not on file. Social History:  reports that she has never smoked. She has never used smokeless tobacco. She reports current alcohol use. She reports that she does not use drugs.     Maternal Diabetes: No Genetic Screening: Normal NIPS Maternal Ultrasounds/Referrals: Normal Fetal Ultrasounds or other Referrals:  None Maternal Substance Abuse:  No Significant Maternal Medications:  Meds include: Other: Wellbutrin  Significant Maternal Lab Results:  Group B Strep negative and Rh negative Other Comments:  None  ROS neg History Dilation: 5 Effacement (%): 80 Station: -1 Exam by:: Rosana Hoes, RN Blood pressure (!) 105/50, pulse 81, temperature 98.9 F (37.2 C), temperature source Axillary, resp. rate 18, height 5\' 3"  (1.6 m), weight 72.1 kg. Exam Physical Exam  Physical exam:  A&O x 3, no acute distress. Pleasant HEENT neg, no thyromegaly Lungs CTA bilat CV RRR, S1S2 normal Abdo soft, non tender, non acute Extr no edema/ tenderness Pelvic above FHT 140s + accels no decels mod variability.  Toco q 3 min    Prenatal labs: ABO, Rh: --/--/B NEG (06/22 1021) Antibody: POS (06/22 1021) Rubella: Immune (11/19 0000) RPR: Nonreactive (06/22 0000)  HBsAg: Negative (11/19 0000)  HIV: Non-reactive (11/19 0000)  GBS: Negative (06/02 0000)  Glucola normal  Assessment/Plan: 33 yo G2P1001 for IOL 39.5 wks, pitocin, epidural as needed, GBS(-), EFW 7.1/2 lbs   Prior SVD 7'4" VAVD    Jeren Dufrane R Angle Dirusso 04/17/2019, 3:21 PM

## 2019-04-17 NOTE — Progress Notes (Signed)
Arrived to assess pt Bloody show, epidural taking time to work, but getting better. SROM with clear fluid but small blood clot per RN Low BP post epidural, now low again 80/50s FHT decel down to 70s, lasted 5 minutes Toco q 2 min, pitocin at 8 mu rate SVE 5/100%/-1 Vx, 4 cm size soft clot noted Efedrin 2nd dose given. Pitocin was at 8 and turned off Terbutaline 0.25 mg [place] given, turned to left lateral. FHT recovered to 140  With rebound tachycardia in 160s, stable, back to cat I Needs foley since bladder full   V.Aarik Blank MD

## 2019-04-17 NOTE — Anesthesia Procedure Notes (Addendum)
Epidural Patient location during procedure: OB Start time: 04/17/2019 2:12 PM End time: 04/17/2019 2:15 PM  Staffing Anesthesiologist: Lyn Hollingshead, MD Performed: anesthesiologist   Preanesthetic Checklist Completed: patient identified, site marked, surgical consent, pre-op evaluation, timeout performed, IV checked, risks and benefits discussed and monitors and equipment checked  Epidural Patient position: sitting Prep: site prepped and draped and DuraPrep Patient monitoring: continuous pulse ox and blood pressure Approach: midline Location: L3-L4 Injection technique: LOR air  Needle:  Needle type: Tuohy  Needle gauge: 17 G Needle length: 9 cm and 9 Needle insertion depth: 4 cm Catheter type: closed end flexible Catheter size: 19 Gauge Catheter at skin depth: 9 cm Test dose: negative and Other  Assessment Sensory level: T9 Events: blood not aspirated, injection not painful, no injection resistance, negative IV test and no paresthesia  Additional Notes Reason for block:procedure for pain

## 2019-04-18 LAB — CBC
HCT: 39.3 % (ref 36.0–46.0)
Hemoglobin: 13.3 g/dL (ref 12.0–15.0)
MCH: 32 pg (ref 26.0–34.0)
MCHC: 33.8 g/dL (ref 30.0–36.0)
MCV: 94.7 fL (ref 80.0–100.0)
Platelets: 190 10*3/uL (ref 150–400)
RBC: 4.15 MIL/uL (ref 3.87–5.11)
RDW: 13.1 % (ref 11.5–15.5)
WBC: 17 10*3/uL — ABNORMAL HIGH (ref 4.0–10.5)
nRBC: 0 % (ref 0.0–0.2)

## 2019-04-18 LAB — RPR: RPR Ser Ql: NONREACTIVE

## 2019-04-18 NOTE — Lactation Note (Signed)
This note was copied from a baby's chart. Lactation Consultation Note  Patient Name: Boy Lakaya Tolen [id number] Date: 04/18/2019 Reason for consult: Initial assessment;Term P2, 9 hour female infant. Per mom, infant has latched 4 times since delivery. Infant had 2 stools and one void since delivery. Per mom, she has had breast implants for past 11 years, she breastfeed her 35 year old without difficulties for 8 months. Mom feels breastfeeding is going well. Mom has colostrum present, she taught back hand expression and infant given 1 ml of colostrum by spoon. Mom latched infant on left breast using cross cradle hold, infant latched with wide mouth gape, chin and nose touching breast, infant was still breastfeeding (8 minutes) when LC left the room. Mom knows to breastfeed according hunger cues, 8 to 12 times within 24 hours and on demand. LC discussed I & O. Mom knows to call Nurse or [place] if she has any questions, concerns or need assistance with latching infant to breast. Mom will continue to do STS. Mom made aware of O/P services, breastfeeding support groups, community resources, and our phone # for post-discharge questions. .   Maternal Data Formula Feeding for Exclusion: No Has patient been taught Hand Expression?: Yes(Infant was given 1 ml of colostrum by spoon.) Does the patient have breastfeeding experience prior to this delivery?: Yes  Feeding Feeding Type: Breast Fed  LATCH Score Latch: Grasps breast easily, tongue down, lips flanged, rhythmical sucking.  Audible Swallowing: Spontaneous and intermittent  Type of Nipple: Everted at rest and after stimulation  Comfort (Breast/Nipple): Soft / non-tender  Hold (Positioning): Assistance needed to correctly position infant at breast and maintain latch.  LATCH Score: 9  Interventions Interventions: Breast feeding basics reviewed;Breast compression;Adjust position;Assisted with latch;Skin to skin;Support pillows;Position  options;Breast massage;Hand express;Expressed milk  Lactation Tools Discussed/Used WIC Program: No   Consult Status Consult Status: Follow-up Date: 04/18/19 Follow-up type: In-patient    Vicente Serene 04/18/2019, 3:44 AM

## 2019-04-18 NOTE — Anesthesia Postprocedure Evaluation (Signed)
Anesthesia Post Note  Patient: [name]  Procedure(s) Performed: AN AD [place]     Patient location during evaluation: Mother Baby Anesthesia Type: Epidural Level of consciousness: awake, awake and alert and oriented Pain management: pain level controlled Vital Signs Assessment: post-procedure vital signs reviewed and stable Respiratory status: spontaneous breathing, nonlabored ventilation and respiratory function stable Cardiovascular status: stable Postop Assessment: no headache, no backache, patient able to bend at knees, no apparent nausea or vomiting, adequate PO intake and able to ambulate Anesthetic complications: no    Last Vitals:  Vitals:   04/18/19 0200 04/18/19 0600  BP: 98/61 99/75  Pulse: 80 79  Resp: 16 18  Temp: 36.8 C (!) 36.4 C  SpO2: 99%     Last Pain:  Vitals:   04/18/19 0600  TempSrc: Oral  PainSc: 4    Pain Goal:                   [name]

## 2019-04-18 NOTE — Progress Notes (Signed)
MOB was referred for history of depression/anxiety. * Referral screened out by Clinical Social Worker because none of the following criteria appear to apply: ~ History of anxiety/depression during this pregnancy, or of post-partum depression following prior delivery. ~ Diagnosis of anxiety and/or depression within last 3 years OR * MOB's symptoms currently being treated with medication and/or therapy. Per chart review, MOB has active order for Wellbutrin.     Please contact the Clinical Social Worker if needs arise, by [facility] request, or if MOB scores greater than 9/yes to question 10 on [place] Postpartum Depression Screen.      Virgie Dad Dayonna Selbe, MSW, LCSW-A [facility] [place] [phone]

## 2019-04-18 NOTE — Lactation Note (Signed)
This note was copied from a baby's chart. Lactation Consultation Note  Patient Name: [name] [id number] Date: 04/18/2019 Reason for consult: Follow-up assessment;Term  1434 - 1450 - I followed up with Ms. Lafon to see how breast feeding was going. She states that her son Grayce Sessions is latching well. She states that he was circumcised at 43 and has not breast fed since that time. I helped her gently wake Grayce Sessions, and observed her place him in cross cradle hold on her right breast. Baby latched and clamped down initially. I gave her a few tips for helping Grayce Sessions deepen his latch at the breast. I also adjusted him just a bit so he was belly to belly and brought him forward on the breast.  Ms. Furney asked some questions about feeding frequency. I discussed feeding on demand 8-12 times a day and waking baby to feed as needed. I educated on day 2/night 2 cluster feeding patterns. We also discussed output expectations; baby has been having adequate wet and dirty diapers.  Ms. Sarkisyan had positive breast changes with this pregnancy. She states that she had good milk volume with her previous child (now 32 years old). She has a new Freestyle pump at home. She will return to work, but her work is primarily at home.  I recommended that Ms. Bayless call PRN for assistance, and she verbalized understanding.    Maternal Data Formula Feeding for Exclusion: No Has patient been taught Hand Expression?: Yes Does the patient have breastfeeding experience prior to this delivery?: Yes  Feeding Feeding Type: Breast Fed  LATCH Score Latch: Grasps breast easily, tongue down, lips flanged, rhythmical sucking.  Audible Swallowing: A few with stimulation  Type of Nipple: Everted at rest and after stimulation  Comfort (Breast/Nipple): Soft / non-tender  Hold (Positioning): No assistance needed to correctly position infant at breast.  LATCH Score: 9  Interventions Interventions: Breast feeding basics  reviewed;Adjust position  Lactation Tools Discussed/Used WIC Program: No   Consult Status Consult Status: Follow-up Date: 04/19/19 Follow-up type: In-patient    Lenore Manner 04/18/2019, 3:00 PM

## 2019-04-18 NOTE — Progress Notes (Addendum)
Post Partum Day 1, SVD, left mediolateral episiotomy due to tight introitus BOY, breast feeding, 8'4", circ done today  Subjective: no complaints, up ad lib, voiding, tolerating PO and some perineal pain and moderate lochia  Objective: Blood pressure 100/66, pulse 95, temperature 98.4 F (36.9 C), temperature source Oral, resp. rate 18, height 5\' 3"  (1.6 m), weight 72.1 kg, SpO2 100 %.  Physical Exam:  General: alert and cooperative Lochia: appropriate Uterine Fundus: firm 1 below U Incision: healing well DVT Evaluation: No evidence of DVT seen on physical exam.  Recent Labs    04/17/19 1048 04/18/19 0734  HGB 13.6 13.3  HCT 39.2 39.3   B negative, Baby also B neg Rub Immune  Assessment/Plan: PPD#1, SVD with small episiotomy.  PP care, pain mngmt. Reports prior h/o perineal laceration infection at 1 week. Will monitor for worsening pain/ hot/red/ swollen-- call office if needed.  H/o depression, stable on Wellbutrin.  Baby also Rh negative, no rhogam needed, circumcision done Anticipate D/c home tomorrow     LOS: 1 day   [name] 04/18/2019, 11:51 AM

## 2019-04-18 NOTE — Progress Notes (Signed)
While this RN was in patient's room doing baby assessment, I noticed out of the corner of my eye patient tried to get out of bed and almost slid out of the bed, but caught herself.  I turned around and asked patient if she was ok.  She stated "Yes, my leg is still weak, I need help to the bathroom."  This RN called for a stedy, put patient in it & took her to the bathroom & returned her to her bed.  I asked patient if any part of her body touched the floor.  She replied "No."  I educated patient about calling for help to the bathroom and requested multiple times that she call for help the next time she needed to get out of bed.  Patient & her significant other stated their understanding.  Reported situation & recommendation of stedy to bedside RN Kathlee Nations.

## 2019-04-19 ENCOUNTER — Encounter (HOSPITAL_COMMUNITY): Payer: Self-pay | Admitting: *Deleted

## 2019-04-19 MED ORDER — IBUPROFEN 600 MG PO TABS: 600.0000 mg | ORAL_TABLET | Freq: Four times a day (QID) | ORAL | 0 refills | Status: DC

## 2019-04-19 NOTE — Discharge Summary (Signed)
OB Discharge Summary  Patient Name: [name] DOB: [birth year] MRN: [id number]  Date of admission: 04/17/2019 Delivering MD: MODY, VAISHALI   Date of discharge: 04/19/2019  Admitting diagnosis: INDUCTION Intrauterine pregnancy: [redacted]w[redacted]d     Secondary diagnosis:Principal Problem:   Postpartum care following vaginal delivery (6/22) Active Problems:   Pregnancy   SVD (spontaneous vaginal delivery)  Additional problems:none     Discharge diagnosis: Term Pregnancy Delivered                                                                     Post partum procedures:none  Augmentation: see LDR notes  Complications: None  Hospital course:  Induction of Labor With Vaginal Delivery   33 y.o. yo G2P1001 at [redacted]w[redacted]d was admitted to the hospital 04/17/2019 for induction of labor.  Indication for induction: Favorable cervix at term.  Patient had an uncomplicated labor course as follows: Membrane Rupture Time/Date: 2:36 PM ,04/17/2019   Intrapartum Procedures: Episiotomy: Left Mediolateral [3]                                         Lacerations:  None [1]  Patient had delivery of a Viable infant.  Information for the patient's newborn:  Rainah, Kirshner [[id number]]  Delivery Method: Vaginal, Spontaneous(Filed from Delivery Summary)    04/17/2019  Details of delivery can be found in separate delivery note.  Patient had a routine postpartum course. Patient is discharged home 04/19/19.  Physical exam  Vitals:   04/18/19 0915 04/18/19 1416 04/18/19 2233 04/19/19 0605  BP: 100/66 100/76 101/65 97/67  Pulse: 95 80 91 80  Resp: 18 16 17 18   Temp: 98.4 F (36.9 C)  98.5 F (36.9 C) 98.3 F (36.8 C)  TempSrc: Oral  Oral Oral  SpO2: 100% 100% 98% 98%  Weight:      Height:       General: alert, cooperative and no distress Lochia: appropriate Uterine Fundus: firm Incision: N/A DVT Evaluation: No evidence of DVT seen on physical exam. Labs: Lab Results  Component Value Date   WBC 17.0  (H) 04/18/2019   HGB 13.3 04/18/2019   HCT 39.3 04/18/2019   MCV 94.7 04/18/2019   PLT 190 04/18/2019   CMP Latest Ref Rng & Units 10/28/2015  Glucose 65 - 99 mg/dL 99  BUN 6 - 20 mg/dL 9  Creatinine 0.44 - 1.00 mg/dL 0.90  Sodium 135 - 145 mmol/L 140  Potassium 3.5 - 5.1 mmol/L 4.3  Chloride 101 - 111 mmol/L 104  CO2 22 - 32 mmol/L 29  Calcium 8.9 - 10.3 mg/dL 9.4  Total Protein 6.0 - 8.3 g/dL -  Total Bilirubin 0.3 - 1.2 mg/dL -  Alkaline Phos 39 - 117 U/L -  AST 0 - 37 U/L -  ALT 0 - 35 U/L -    Discharge instruction: per After Visit Summary and "Baby and Me Booklet".  After Visit Meds:  Allergies as of 04/19/2019      Reactions   Gluten Meal Other (See Comments)   Celiac disease      Medication List    TAKE these medications  ALPHA LIPOIC ACID PO Take 1 tablet by mouth 2 (two) times daily.   BERBERINE COMPLEX PO Take 1 tablet by mouth 2 (two) times daily.   buPROPion 150 MG 24 hr tablet Commonly known as: WELLBUTRIN XL Take 150 mg by mouth daily.   ibuprofen 600 MG tablet Commonly known as: ADVIL Take 1 tablet (600 mg total) by mouth every 6 (six) hours.   linaclotide 72 MCG capsule Commonly known as: LINZESS Take 1 capsule by mouth daily.       Diet: routine diet  Activity: Advance as tolerated. Pelvic rest for 6 weeks.   Outpatient follow up:6 weeks Follow up Appt:No future appointments. Follow up visit: No follow-ups on file.  Postpartum contraception: Not Discussed  Newborn Data: Live born female  Birth Weight: 8 lb 4.5 oz (3756 g) APGAR: 8, 9  Newborn Delivery   Birth date/time: 04/17/2019 18:40:00 Delivery type: Vaginal, Spontaneous      Baby Feeding: Bottle Disposition:home with mother   04/19/2019 Lendon ColonelKelly A Vondell Sowell, MD

## 2019-04-19 NOTE — Lactation Note (Signed)
This note was copied from a baby's chart. Lactation Consultation Note  Patient Name: [name] ONGEX'B Date: 04/19/2019 Reason for consult: Follow-up assessment;Infant weight loss;Term;Other (Comment);Breast augmentation(milk coming in) 6 % weight loss / Bili check 4.1 at 35 hours  LC reviewed and updated the doc flow sheets per mom  Per mom baby last fed at 7:30 - 8:15 am and of presently asleep in dad's arms Per mom nipples are sore. LC offered to assess/ mom receptive/ no breakdown noted,  Breast warm and full. Areola edema at the base. LC recommended prior to latch - breast massage,  Hand express, pre-pump if needed and latch STS with firm support.  LC showed mom reverse pressure if needed, and showed where the baby;s mouth should be positioned  When deeply latched.  Sore nipple and engorgement prevention and tx .  LC instructed mom on the use shells, and hand pump.  Mom aware of the [facility] resources at [facility] health.     Maternal Data Has patient been taught Hand Expression?: Yes  Feeding Feeding Type: (per mom baby last fed at 0730 - 0815)  LATCH Score                   Interventions Interventions: Breast feeding basics reviewed;Breast massage;Hand express;Pre-pump if needed;Reverse pressure;Shells;Hand pump  Lactation Tools Discussed/Used Tools: Shells;Pump;Flanges Flange Size: 24 Shell Type: Inverted Breast pump type: Manual   Consult Status Consult Status: Complete Date: 04/19/19    Myer Haff 04/19/2019, 9:42 AM

## 2019-04-20 LAB — TYPE AND SCREEN
ABO/RH(D): B NEG
Antibody Screen: POSITIVE
Unit division: 0
Unit division: 0

## 2019-04-20 LAB — BPAM RBC
Blood Product Expiration Date: 202007192359
Blood Product Expiration Date: 202007202359
Unit Type and Rh: 1700
Unit Type and Rh: 1700

## 2019-07-05 ENCOUNTER — Ambulatory Visit: Payer: Self-pay

## 2019-07-05 NOTE — Lactation Note (Addendum)
This note was copied from a baby's chart. Lactation Consultation Note  Patient Name: [name] Today's Date: 07/05/2019     07/05/2019  Name: [name] MRN: [id number] Date of Birth: 04/17/2021 Gestational Age: Gestational Age: 25107w5d Birth Weight: 132.5 oz Weight today:    12 pounds 14.6 ounces (5856 grams) with clean size 2 diaper   772 month old infant presents today with mom for feeding assessment. Mom concerned that infant having green mucousy stools that started about 7 weeks when he started sleeping through the night. Mom is very full in the morning and she can pump off 10 ounces in 15 minutes and the milk is very translucent with little fat in it. She reports her milk looks normal otherwise. Mom feels like for the most part infant empties the breast. The right breast produces more and she is noticing recently that he is not emptying the right breast well. She has not experiences plugged ducts.   Mom would like to BF through cold and flu season. Infant has had a cold recently but mucous started prior to that.   Infant has gained 2335 grams in the last 77 days with an average daily weight gain of 30 grams a day.   Infant feeds 5 x a day at 7, 10:30, 2, 5 and 8 pm. Infant sleeps from 8:30-6:30 at night. He has awakened the last 2 nights at 3 am to feed. Mom feels less full in the evening feedings and pumped and is able to get 5-5.5 ounces. Mom is pre pumping in the morning and pumping off 5 ounces in 10 minutes the morning and then feeds infant.   Infant with some gassiness and mom will given Gripe water. She reports he is not overly gassy.   Mom is returning to work next week. She has some milk stored for returning to work.   Infant takes a bottle occasionally when mom needs to be away. She gives him 1 ounce in a bottle in the evening for Vitamin D and Probiotics.   Mom is prepumping in the morning due to fullness and occasionally otherwise. Mom had to prepump with her  older son as he was on the same feeding plan (mom's on call) with her older son. She would like to stop the prepumping. Mom reports she did not have a drop in milk supply with her older son. She took Sudafed and it took a while to dry up her milk supply.   Mom has tried to given infant formula and infant refuses. Infant gags and will not take it. Mom has tried Happy Baby and Alimentum. Mom has removed diary from her diet due to green mucousy stools at 368 weeks of age. She has not noticed a difference in his stools. Most stools are mucousy, not all are green.(about 1/2, mainly in the afternoons). Mom reports infant is not a fussy baby. Infant will spit up about once a day, can be a larger amount with curdled milk in it. Mom allows infant to sit upright after feedings. Mom to discuss formula with Ped.   Infant with thick short labial frenulum. Upper lip tight with flanging. Infant with intermittent clicking with feeding. Mom with no pain with feedings. Nipple rounded post feeding. Infant with thick posterior lingual frenulum noted. Infant with good tongue extension and lateralization. Infant with tongue thrusting and biting noted on gloved finger. Discussed with mom that some infants can have green mucousy stools with tongue and lip restrictions. Reviewed that  infant seems to have a tongue and lip restriction. Handout given on tongue and lip restrictions and local provider information. Mom to research and to see if infant needs follow up.   Discussed with mom that occasionally moms can have increased prolactin levels and some people can have a cyst on the Pituitary but is unusual. Discussed can speak with OB about.   Discussed sunflower Lecithin to try since right breast feels a little different with possible inflammation. Mom has not felt any lumps or plugged ducts.   Infant to follow up with Dr. Vonna Kotyk at 4 months. Infant to follow up with Lactation as needed.      General Information: Mother's  reason for visit: Feeding assessment, concerns with increased milk supply in the AM, Green mucousy stools Consult: Initial Lactation consultant: Jasmine December [place] RN,IBCLC Breastfeeding experience: BF 5 x a day Maternal medical conditions: Breast augmentation(incisions under breast, implants behing muscle) Maternal medications: Pre-natal vitamin(Merena, Wellbutrin, Vitamin D, Priobiotic, Senekot)  Breastfeeding History: Frequency of breast feeding: 5 x a day ( 7, 10:30, 2, 5, 8) Occasional 3 am feeding Duration of feeding: 30-40 minutes  Supplementation: Supplement method: bottle(Dr. Brown's Level 1)         Breast milk volume: 1 ounce once a day Breast milk frequency: 1 x a day Total breast milk volume per day: 1 ounce Pump type: Medela pump in style Pump frequency: 1-2 x a day Pump volume: 5-10 ounces  Infant Output Assessment: Voids per 24 hours: 5+ Urine color: Clear yellow Stools per 24 hours: 5 Stool color: Green(1/2 green, 1/2 yellow)  Breast Assessment: Breast: Soft, Compressible Nipple: Erect Pain level: 0 Pain interventions: Bra, Breast pump  Feeding Assessment: Infant oral assessment: Variance Infant oral assessment comment: see note Positioning: Cradle Latch: 2 - Grasps breast easily, tongue down, lips flanged, rhythmical sucking. Audible swallowing: 2 - Spontaneous and intermittent Type of nipple: 2 - Everted at rest and after stimulation Comfort: 2 - Soft/non-tender Hold: 2 - No assistance needed to correctly position infant at breast LATCH score: 10 Latch assessment: Deep Lips flanged: Yes Suck assessment: Displays both   Pre-feed weight: 5856 grams Post feed weight: 5922 grams Amount transferred: 66 ml Amount supplemented: 0  Additional Feeding Assessment: Infant oral assessment: Variance Infant oral assessment comment: see note Positioning: Cradle Latch: 2 - Grasps breast easily, tongue down, lips flanged, rhythmical sucking. Audible swallowing:  2 - Spontaneous and intermittent Type of nipple: 2 - Everted at rest and after stimulation Comfort: 2 - Soft/non-tender Hold: 2 - No assistance needed to correctly position infant at breast LATCH score: 10 Latch assessment: Deep Lips flanged: Yes Suck assessment: Displays both   Pre-feed weight: 5922 grams Post feed weight: 5978 grams Amount transferred: 56 grams Amount supplemented: 0  Totals: Total amount transferred: 122 ml Total supplement given: 0 Total amount pumped post feed: did not pump   Plan:  1. Offer infant breast with feeding cues 2. Empty one breast before offering the second breast 3. Would recommend that you only feed on one breast in the morning and pump the other one for now if have not prepumped. 4. Burp infant frequently with feeding 5. Keep infant upright after feeding for about 20 minutes 6. Sunflower Lecithin may be helpful to help with the right breast. The usual dosage is 1200 capsules and take one 4 x a day (Breakfast, Lunch, dinner, and bedtime) . Can decrease to 2 x a day once issues are resolved. 7. Would recommend that you  pump the 2nd breast in the morning. Initially pump to empty for a few days and then start decreasing the minutes every 2-3 days. ( start with 15 minutes and decrease by 2-3 minutes every 3 days until more comfortable.  8. Keep up the good work 9. Thank you for allowing me to assist you today 10. Please call with any questions/concerns as needed [phone] 11. Follow up with Lactation as needed   Donn Pierini RN, IBCLC                                                   Debby Freiberg Mira Balon 07/05/2019, 9:23 AM

## 2020-11-28 ENCOUNTER — Other Ambulatory Visit: Payer: Self-pay

## 2020-11-28 ENCOUNTER — Encounter: Payer: Self-pay | Admitting: Plastic Surgery

## 2020-11-28 ENCOUNTER — Ambulatory Visit (INDEPENDENT_AMBULATORY_CARE_PROVIDER_SITE_OTHER): Payer: BC Managed Care – PPO | Admitting: Plastic Surgery

## 2020-11-28 VITALS — BP 102/67 | HR 106 | Ht 63.0 in | Wt 112.0 lb

## 2020-11-28 DIAGNOSIS — L989 Disorder of the skin and subcutaneous tissue, unspecified: Secondary | ICD-10-CM

## 2020-11-28 NOTE — Progress Notes (Signed)
Referring Provider [place], [place], [place] [address] [place],  [place] [address]   CC:  Chief Complaint  Patient presents with  . Advice Only      [name] is an 35 y.o. female.  HPI: Patient presents to discuss two symptomatic areas in her central face. She feels for the past year or so she will developed some swelling subcutaneously in her central glabella and central nasal tip. These will grow in size and become more visible and photographs and to herself. She will occasionally try to squeeze these and there is very little clear drainage that can be expressed intermittently. They certainly fluctuate between periods of being swollen and being flat. At the moment she has a hard time feeling much in her central glabellar area but on her nasal tip there is some clear signs of erythema from the lesion in question. She has seen her dermatologist who believes these are cysts. She is here to seek some solutions for this issue.  Allergies  Allergen Reactions  . Gluten Meal Other (See Comments)    Celiac disease    Outpatient Encounter Medications as of 11/28/2020  Medication Sig  . amphetamine-dextroamphetamine (ADDERALL) 20 MG tablet Take 20 mg by mouth 2 (two) times daily.  Marland Kitchen buPROPion (WELLBUTRIN XL) 150 MG 24 hr tablet Take 150 mg by mouth daily.  Marland Kitchen levonorgestrel (MIRENA) 20 MCG/24HR IUD by Intrauterine route.  Marland Kitchen OVER THE COUNTER MEDICATION Probiotic-Supplement  . senna-docusate (SENOKOT-S) 8.6-50 MG tablet Take by mouth. As directed.  Marland Kitchen spironolactone (ALDACTONE) 100 MG tablet Take 100 mg by mouth 2 (two) times daily.  Marland Kitchen tretinoin (RETIN-A) 0.1 % cream Apply topically daily.  . [DISCONTINUED] ALPHA LIPOIC ACID PO Take 1 tablet by mouth 2 (two) times daily.  . [DISCONTINUED] Barberry-Oreg Grape-Goldenseal (BERBERINE COMPLEX PO) Take 1 tablet by mouth 2 (two) times daily.  . [DISCONTINUED] ibuprofen (ADVIL) 600 MG tablet Take 1 tablet (600 mg total) by mouth every 6  (six) hours.  . [DISCONTINUED] linaclotide (LINZESS) 72 MCG capsule Take 1 capsule by mouth daily.   No facility-administered encounter medications on file as of 11/28/2020.     Past Medical History:  Diagnosis Date  . Abnormal heart rhythm   . ADD (attention deficit disorder)   . Asthma    "mild"  . Celiac disease   . Depression    on Wellbutrin  . Migraine   . Normal labor 10/19/2013  . Vacuum extractor delivery, delivered (12/26) 10/20/2013    Past Surgical History:  Procedure Laterality Date  . BREAST ENHANCEMENT SURGERY    . MANDIBLE SURGERY      Family History  Problem Relation Age of Onset  . Diabetes Neg Hx     Social History   Social History Narrative  . Not on file     Review of Systems General: Denies fevers, chills, weight loss CV: Denies chest pain, shortness of breath, palpitations  Physical Exam Vitals with BMI 11/28/2020 04/19/2019 04/18/2019  Height 5\' 3"  - -  Weight 112 lbs - -  BMI 19.84 - -  Systolic 102 97 101  Diastolic 67 67 65  Pulse 106 80 91    General:  No acute distress,  Alert and oriented, Non-Toxic, Normal speech and affect Examination of the glabella is largely unremarkable. She did bring up pictures which do show a notable area of fullness that is about a centimeter and a half in diameter. Follow-up pictures show a bit of erythema in  that area after it has been decompressed. On the tip of her nose currently there is about a centimeter sized area of erythema with the skin and feeling thinner in that area and she says this corresponds to the decompressed area of swelling or cyst. There is no scars in this area or signs of trauma. She has mild surrounding acne in the forehead and cheeks.  Assessment/Plan Patient presents with cystic type lesions in the glabella and nasal tip. There is no clear palpable deformity to me in the glabella currently. I do not see any skin changes or signs of any punctum. I did offer her my hesitation in an  aggressive excision in the glabella or nasal tip given a scar that would undoubtedly result. I did offer to do a punch biopsy and these areas which should have minimal scar and might yield some diagnostic information that would be helpful. She seems very interested in this plan as these lesions have been particularly bothersome for her. We discussed the risks include bleeding, infection, damage surrounding structures need for additional procedures. All of her questions were answered and we will plan to proceed with this in the office.  Allena Napoleon 11/28/2020, 4:25 PM

## 2020-12-26 ENCOUNTER — Other Ambulatory Visit (HOSPITAL_COMMUNITY)
Admission: RE | Admit: 2020-12-26 | Discharge: 2020-12-26 | Disposition: A | Discharge status: Home / Self Care | Payer: BC Managed Care – PPO | Source: Ambulatory Visit | Attending: Plastic Surgery | Admitting: Plastic Surgery

## 2020-12-26 ENCOUNTER — Ambulatory Visit (INDEPENDENT_AMBULATORY_CARE_PROVIDER_SITE_OTHER): Payer: BC Managed Care – PPO | Admitting: Plastic Surgery

## 2020-12-26 ENCOUNTER — Other Ambulatory Visit: Payer: Self-pay

## 2020-12-26 ENCOUNTER — Encounter: Payer: Self-pay | Admitting: Plastic Surgery

## 2020-12-26 VITALS — BP 120/80 | HR 104

## 2020-12-26 DIAGNOSIS — L989 Disorder of the skin and subcutaneous tissue, unspecified: Secondary | ICD-10-CM | POA: Diagnosis not present

## 2020-12-26 DIAGNOSIS — L929 Granulomatous disorder of the skin and subcutaneous tissue, unspecified: Secondary | ICD-10-CM

## 2020-12-26 NOTE — Progress Notes (Signed)
Operative Note   DATE OF OPERATION: 12/26/2020  LOCATION:    SURGICAL DEPARTMENT: Plastic Surgery  PREOPERATIVE DIAGNOSES: Inflammatory process of glabella and nasal tip  POSTOPERATIVE DIAGNOSES:  same  PROCEDURE:  1. Punch biopsy glabella and nasal tip 3 mm each  SURGEON: Ancil Linsey, MD  ANESTHESIA:  Local  COMPLICATIONS: None.   INDICATIONS FOR PROCEDURE:  The patient, [name] is a 35 y.o. female born on 01/30/1986, is here for treatment of an unusual inflammatory process of the glabella and nasal tip.  She will intermittently get swelling and feel like she has fluid in those locations and then it will dissipate. MRN: [id number]  CONSENT:  Informed consent was obtained directly from the patient. Risks, benefits and alternatives were fully discussed. Specific risks including but not limited to bleeding, infection, hematoma, seroma, scarring, pain, infection, wound healing problems, and need for further surgery were all discussed. The patient did have an ample opportunity to have questions answered to satisfaction.   DESCRIPTION OF PROCEDURE:  Local anesthesia was administered. The patient's operative site was prepped and draped in a sterile fashion. A time out was performed and all information was confirmed to be correct.  3 mm punch was used to take a specimen from both the glabella and the nasal tip in the areas that she say are the most bothersome to her.  5-0 fast gut was used in both locations.  For simple closure  The patient tolerated the procedure well.  There were no complications.

## 2021-01-01 LAB — SURGICAL PATHOLOGY

## 2021-01-07 ENCOUNTER — Telehealth: Payer: Self-pay

## 2021-01-07 NOTE — Telephone Encounter (Signed)
Patient called to let us know that she has a schedule conflict and won't be able to make her 11am appointment tomorrow.  Patient said that all of her stitches have come out.  Patient would like to know the results of her biopsy and next steps.  Please call.

## 2021-01-09 ENCOUNTER — Ambulatory Visit: Payer: BC Managed Care – PPO | Admitting: Plastic Surgery

## 2021-01-10 ENCOUNTER — Telehealth: Payer: Self-pay

## 2021-01-10 NOTE — Telephone Encounter (Signed)
01/09/21 call returned to pt for the following: Pt had to reschedule her f/u appointment & would like to know the path report. Status post- Glabella/skin biopsy on 12/26/20 by Dr. Arita Miss I consulted Dr. Arita Miss- he confirmed that the path report indicates that there was no malignancy, there was "inflammed/reactive skin". He recommended that she schedule a virtual/phone visit with him to discuss further plan of care. Pt agrees & we scheduled a tele-visit with Dr. Arita Miss on 01/15/21 @ 12:30pm

## 2021-01-15 ENCOUNTER — Telehealth (INDEPENDENT_AMBULATORY_CARE_PROVIDER_SITE_OTHER): Payer: BC Managed Care – PPO | Admitting: Plastic Surgery

## 2021-01-15 ENCOUNTER — Other Ambulatory Visit: Payer: Self-pay

## 2021-01-15 DIAGNOSIS — L989 Disorder of the skin and subcutaneous tissue, unspecified: Secondary | ICD-10-CM

## 2021-01-15 NOTE — Progress Notes (Signed)
The patient gave consent to have this visit done by telemedicine / virtual visit, two identifiers were used to identify patient. This is also consent for access the chart and treat the patient via this visit. The patient is located at work.  I, the provider, am at the office.  We spent 10 minutes together for the visit.    I discussed [name]'s biopsy results with her today from her punch biopsies.  This showed seborrheic dermatitis and granulomatous inflammation of the biopsy sites.  Given her clinical presentation and the way that she is described these areas I do not believe that there is a subcutaneous lesion that could be excised readily in either of the glabella or the nasal tip.  I do believe that it would put her at risk of a scar in those areas and so is not to be taken lightly.  I explained I would probably start with some topical treatments but feel that a dermatologist would be better to direct that course then myself.  She is in agreement and will reach out to her dermatologist.  We will plan to touch base with her again on an as-needed basis.

## 2021-07-02 DIAGNOSIS — M542 Cervicalgia: Secondary | ICD-10-CM | POA: Diagnosis not present

## 2021-07-13 DIAGNOSIS — Z20822 Contact with and (suspected) exposure to covid-19: Secondary | ICD-10-CM | POA: Diagnosis not present

## 2021-07-13 DIAGNOSIS — B349 Viral infection, unspecified: Secondary | ICD-10-CM | POA: Diagnosis not present

## 2021-07-23 DIAGNOSIS — L0100 Impetigo, unspecified: Secondary | ICD-10-CM | POA: Diagnosis not present

## 2021-07-23 DIAGNOSIS — R21 Rash and other nonspecific skin eruption: Secondary | ICD-10-CM | POA: Diagnosis not present

## 2021-08-22 DIAGNOSIS — J310 Chronic rhinitis: Secondary | ICD-10-CM | POA: Diagnosis not present

## 2021-08-22 DIAGNOSIS — R058 Other specified cough: Secondary | ICD-10-CM | POA: Diagnosis not present

## 2021-08-22 DIAGNOSIS — H6981 Other specified disorders of Eustachian tube, right ear: Secondary | ICD-10-CM | POA: Diagnosis not present

## 2021-08-22 DIAGNOSIS — R0982 Postnasal drip: Secondary | ICD-10-CM | POA: Diagnosis not present

## 2021-08-29 DIAGNOSIS — Z23 Encounter for immunization: Secondary | ICD-10-CM | POA: Diagnosis not present

## 2021-08-29 DIAGNOSIS — L719 Rosacea, unspecified: Secondary | ICD-10-CM | POA: Diagnosis not present

## 2021-10-14 DIAGNOSIS — R140 Abdominal distension (gaseous): Secondary | ICD-10-CM | POA: Diagnosis not present

## 2021-10-14 DIAGNOSIS — R102 Pelvic and perineal pain: Secondary | ICD-10-CM | POA: Diagnosis not present

## 2021-10-14 DIAGNOSIS — T8389XA Other specified complication of genitourinary prosthetic devices, implants and grafts, initial encounter: Secondary | ICD-10-CM | POA: Diagnosis not present

## 2021-10-14 DIAGNOSIS — R238 Other skin changes: Secondary | ICD-10-CM | POA: Diagnosis not present

## 2021-11-03 DIAGNOSIS — Z20822 Contact with and (suspected) exposure to covid-19: Secondary | ICD-10-CM | POA: Diagnosis not present

## 2021-11-11 DIAGNOSIS — G5601 Carpal tunnel syndrome, right upper limb: Secondary | ICD-10-CM | POA: Diagnosis not present

## 2021-11-13 DIAGNOSIS — Z975 Presence of (intrauterine) contraceptive device: Secondary | ICD-10-CM | POA: Diagnosis not present

## 2021-12-12 DIAGNOSIS — L719 Rosacea, unspecified: Secondary | ICD-10-CM | POA: Diagnosis not present

## 2021-12-12 DIAGNOSIS — Z5181 Encounter for therapeutic drug level monitoring: Secondary | ICD-10-CM | POA: Diagnosis not present

## 2021-12-12 DIAGNOSIS — Z23 Encounter for immunization: Secondary | ICD-10-CM | POA: Diagnosis not present

## 2022-01-16 DIAGNOSIS — L719 Rosacea, unspecified: Secondary | ICD-10-CM | POA: Diagnosis not present

## 2022-01-16 DIAGNOSIS — L700 Acne vulgaris: Secondary | ICD-10-CM | POA: Diagnosis not present

## 2022-01-23 DIAGNOSIS — M542 Cervicalgia: Secondary | ICD-10-CM | POA: Diagnosis not present

## 2022-01-26 DIAGNOSIS — F900 Attention-deficit hyperactivity disorder, predominantly inattentive type: Secondary | ICD-10-CM | POA: Diagnosis not present

## 2022-01-26 DIAGNOSIS — Z0000 Encounter for general adult medical examination without abnormal findings: Secondary | ICD-10-CM | POA: Diagnosis not present

## 2022-01-26 DIAGNOSIS — Z13220 Encounter for screening for lipoid disorders: Secondary | ICD-10-CM | POA: Diagnosis not present

## 2022-01-26 DIAGNOSIS — G43909 Migraine, unspecified, not intractable, without status migrainosus: Secondary | ICD-10-CM | POA: Diagnosis not present

## 2022-01-26 DIAGNOSIS — K900 Celiac disease: Secondary | ICD-10-CM | POA: Diagnosis not present

## 2022-01-26 DIAGNOSIS — F411 Generalized anxiety disorder: Secondary | ICD-10-CM | POA: Diagnosis not present

## 2022-01-26 DIAGNOSIS — R5383 Other fatigue: Secondary | ICD-10-CM | POA: Diagnosis not present

## 2022-02-17 DIAGNOSIS — R609 Edema, unspecified: Secondary | ICD-10-CM | POA: Diagnosis not present

## 2022-02-17 DIAGNOSIS — L700 Acne vulgaris: Secondary | ICD-10-CM | POA: Diagnosis not present

## 2022-02-17 DIAGNOSIS — L719 Rosacea, unspecified: Secondary | ICD-10-CM | POA: Diagnosis not present

## 2022-02-17 DIAGNOSIS — Z79899 Other long term (current) drug therapy: Secondary | ICD-10-CM | POA: Diagnosis not present

## 2022-03-19 DIAGNOSIS — L719 Rosacea, unspecified: Secondary | ICD-10-CM | POA: Diagnosis not present

## 2022-03-19 DIAGNOSIS — Z79899 Other long term (current) drug therapy: Secondary | ICD-10-CM | POA: Diagnosis not present

## 2022-03-19 DIAGNOSIS — L700 Acne vulgaris: Secondary | ICD-10-CM | POA: Diagnosis not present

## 2022-03-31 DIAGNOSIS — R0982 Postnasal drip: Secondary | ICD-10-CM | POA: Diagnosis not present

## 2022-03-31 DIAGNOSIS — Z03818 Encounter for observation for suspected exposure to other biological agents ruled out: Secondary | ICD-10-CM | POA: Diagnosis not present

## 2022-03-31 DIAGNOSIS — J209 Acute bronchitis, unspecified: Secondary | ICD-10-CM | POA: Diagnosis not present

## 2022-04-20 DIAGNOSIS — L700 Acne vulgaris: Secondary | ICD-10-CM | POA: Diagnosis not present

## 2022-04-20 DIAGNOSIS — Z79899 Other long term (current) drug therapy: Secondary | ICD-10-CM | POA: Diagnosis not present

## 2022-05-09 DIAGNOSIS — L089 Local infection of the skin and subcutaneous tissue, unspecified: Secondary | ICD-10-CM | POA: Diagnosis not present

## 2022-05-09 DIAGNOSIS — Z6820 Body mass index (BMI) 20.0-20.9, adult: Secondary | ICD-10-CM | POA: Diagnosis not present

## 2022-05-09 DIAGNOSIS — S81811A Laceration without foreign body, right lower leg, initial encounter: Secondary | ICD-10-CM | POA: Diagnosis not present

## 2022-05-21 DIAGNOSIS — L700 Acne vulgaris: Secondary | ICD-10-CM | POA: Diagnosis not present

## 2022-05-21 DIAGNOSIS — Z79899 Other long term (current) drug therapy: Secondary | ICD-10-CM | POA: Diagnosis not present

## 2022-06-02 DIAGNOSIS — J029 Acute pharyngitis, unspecified: Secondary | ICD-10-CM | POA: Diagnosis not present

## 2022-06-02 DIAGNOSIS — R5383 Other fatigue: Secondary | ICD-10-CM | POA: Diagnosis not present

## 2022-06-02 DIAGNOSIS — Z03818 Encounter for observation for suspected exposure to other biological agents ruled out: Secondary | ICD-10-CM | POA: Diagnosis not present

## 2022-06-02 DIAGNOSIS — M791 Myalgia, unspecified site: Secondary | ICD-10-CM | POA: Diagnosis not present

## 2022-06-24 DIAGNOSIS — Z79899 Other long term (current) drug therapy: Secondary | ICD-10-CM | POA: Diagnosis not present

## 2022-06-24 DIAGNOSIS — L718 Other rosacea: Secondary | ICD-10-CM | POA: Diagnosis not present

## 2022-07-30 DIAGNOSIS — Z5181 Encounter for therapeutic drug level monitoring: Secondary | ICD-10-CM | POA: Diagnosis not present

## 2022-07-30 DIAGNOSIS — Z23 Encounter for immunization: Secondary | ICD-10-CM | POA: Diagnosis not present

## 2022-07-30 DIAGNOSIS — G43909 Migraine, unspecified, not intractable, without status migrainosus: Secondary | ICD-10-CM | POA: Diagnosis not present

## 2022-07-30 DIAGNOSIS — F900 Attention-deficit hyperactivity disorder, predominantly inattentive type: Secondary | ICD-10-CM | POA: Diagnosis not present

## 2022-07-30 DIAGNOSIS — F411 Generalized anxiety disorder: Secondary | ICD-10-CM | POA: Diagnosis not present

## 2022-07-30 DIAGNOSIS — R609 Edema, unspecified: Secondary | ICD-10-CM | POA: Diagnosis not present

## 2022-08-27 DIAGNOSIS — L718 Other rosacea: Secondary | ICD-10-CM | POA: Diagnosis not present

## 2022-08-27 DIAGNOSIS — L700 Acne vulgaris: Secondary | ICD-10-CM | POA: Diagnosis not present

## 2022-08-27 DIAGNOSIS — L738 Other specified follicular disorders: Secondary | ICD-10-CM | POA: Diagnosis not present

## 2022-11-05 DIAGNOSIS — J209 Acute bronchitis, unspecified: Secondary | ICD-10-CM | POA: Diagnosis not present

## 2022-11-05 DIAGNOSIS — Z6820 Body mass index (BMI) 20.0-20.9, adult: Secondary | ICD-10-CM | POA: Diagnosis not present

## 2022-11-25 DIAGNOSIS — L700 Acne vulgaris: Secondary | ICD-10-CM | POA: Diagnosis not present

## 2022-11-25 DIAGNOSIS — L718 Other rosacea: Secondary | ICD-10-CM | POA: Diagnosis not present

## 2022-12-29 DIAGNOSIS — L718 Other rosacea: Secondary | ICD-10-CM | POA: Diagnosis not present

## 2022-12-29 DIAGNOSIS — L700 Acne vulgaris: Secondary | ICD-10-CM | POA: Diagnosis not present

## 2023-01-29 DIAGNOSIS — Z0000 Encounter for general adult medical examination without abnormal findings: Secondary | ICD-10-CM | POA: Diagnosis not present

## 2023-01-29 DIAGNOSIS — K900 Celiac disease: Secondary | ICD-10-CM | POA: Diagnosis not present

## 2023-01-29 DIAGNOSIS — F411 Generalized anxiety disorder: Secondary | ICD-10-CM | POA: Diagnosis not present

## 2023-01-29 DIAGNOSIS — Z13220 Encounter for screening for lipoid disorders: Secondary | ICD-10-CM | POA: Diagnosis not present

## 2023-01-29 DIAGNOSIS — F900 Attention-deficit hyperactivity disorder, predominantly inattentive type: Secondary | ICD-10-CM | POA: Diagnosis not present

## 2023-01-29 DIAGNOSIS — N289 Disorder of kidney and ureter, unspecified: Secondary | ICD-10-CM | POA: Diagnosis not present

## 2023-01-29 DIAGNOSIS — Z23 Encounter for immunization: Secondary | ICD-10-CM | POA: Diagnosis not present

## 2023-01-29 DIAGNOSIS — G43909 Migraine, unspecified, not intractable, without status migrainosus: Secondary | ICD-10-CM | POA: Diagnosis not present

## 2023-03-06 DIAGNOSIS — M542 Cervicalgia: Secondary | ICD-10-CM | POA: Diagnosis not present

## 2023-04-01 DIAGNOSIS — L718 Other rosacea: Secondary | ICD-10-CM | POA: Diagnosis not present

## 2023-04-01 DIAGNOSIS — L719 Rosacea, unspecified: Secondary | ICD-10-CM | POA: Diagnosis not present

## 2023-04-01 DIAGNOSIS — L700 Acne vulgaris: Secondary | ICD-10-CM | POA: Diagnosis not present

## 2023-05-04 DIAGNOSIS — L578 Other skin changes due to chronic exposure to nonionizing radiation: Secondary | ICD-10-CM | POA: Diagnosis not present

## 2023-05-04 DIAGNOSIS — L821 Other seborrheic keratosis: Secondary | ICD-10-CM | POA: Diagnosis not present

## 2023-05-04 DIAGNOSIS — D1801 Hemangioma of skin and subcutaneous tissue: Secondary | ICD-10-CM | POA: Diagnosis not present

## 2023-06-02 DIAGNOSIS — L718 Other rosacea: Secondary | ICD-10-CM | POA: Diagnosis not present

## 2023-06-02 DIAGNOSIS — L649 Androgenic alopecia, unspecified: Secondary | ICD-10-CM | POA: Diagnosis not present

## 2023-06-02 DIAGNOSIS — L700 Acne vulgaris: Secondary | ICD-10-CM | POA: Diagnosis not present

## 2023-07-08 DIAGNOSIS — M8708 Idiopathic aseptic necrosis of bone, other site: Secondary | ICD-10-CM | POA: Diagnosis not present

## 2023-07-08 DIAGNOSIS — M25532 Pain in left wrist: Secondary | ICD-10-CM | POA: Diagnosis not present

## 2023-07-13 DIAGNOSIS — M542 Cervicalgia: Secondary | ICD-10-CM | POA: Diagnosis not present

## 2023-07-26 DIAGNOSIS — M25532 Pain in left wrist: Secondary | ICD-10-CM | POA: Diagnosis not present

## 2023-08-03 DIAGNOSIS — M25532 Pain in left wrist: Secondary | ICD-10-CM | POA: Diagnosis not present

## 2023-08-03 DIAGNOSIS — G5602 Carpal tunnel syndrome, left upper limb: Secondary | ICD-10-CM | POA: Diagnosis not present

## 2023-08-03 DIAGNOSIS — M8708 Idiopathic aseptic necrosis of bone, other site: Secondary | ICD-10-CM | POA: Diagnosis not present

## 2023-08-20 ENCOUNTER — Other Ambulatory Visit (HOSPITAL_BASED_OUTPATIENT_CLINIC_OR_DEPARTMENT_OTHER): Payer: Self-pay

## 2023-08-20 MED ORDER — DAPSONE 7.5 % EX GEL
1.0000 "application " | Freq: Every day | CUTANEOUS | 2 refills | Status: AC
  Filled 2023-08-20: qty 60, 30d supply, fill #0

## 2023-08-23 ENCOUNTER — Other Ambulatory Visit (HOSPITAL_BASED_OUTPATIENT_CLINIC_OR_DEPARTMENT_OTHER): Payer: Self-pay

## 2023-08-23 MED ORDER — METRONIDAZOLE 0.75 % EX CREA
1.0000 | TOPICAL_CREAM | Freq: Two times a day (BID) | CUTANEOUS | 2 refills | Status: AC
  Filled 2023-08-23: qty 45, 23d supply, fill #0

## 2023-08-31 DIAGNOSIS — M8708 Idiopathic aseptic necrosis of bone, other site: Secondary | ICD-10-CM | POA: Diagnosis not present

## 2023-08-31 DIAGNOSIS — M542 Cervicalgia: Secondary | ICD-10-CM | POA: Diagnosis not present

## 2023-08-31 DIAGNOSIS — M25532 Pain in left wrist: Secondary | ICD-10-CM | POA: Diagnosis not present

## 2023-09-02 ENCOUNTER — Other Ambulatory Visit: Payer: Self-pay

## 2023-09-02 ENCOUNTER — Other Ambulatory Visit (HOSPITAL_BASED_OUTPATIENT_CLINIC_OR_DEPARTMENT_OTHER): Payer: Self-pay

## 2023-09-02 MED ORDER — DAPSONE 7.5 % EX GEL
1.0000 | Freq: Every day | CUTANEOUS | 2 refills | Status: AC
  Filled 2023-09-02: qty 60, 30d supply, fill #0

## 2023-09-06 ENCOUNTER — Other Ambulatory Visit (HOSPITAL_BASED_OUTPATIENT_CLINIC_OR_DEPARTMENT_OTHER): Payer: Self-pay

## 2023-09-06 MED ORDER — AMPHETAMINE-DEXTROAMPHETAMINE 20 MG PO TABS
20.0000 mg | ORAL_TABLET | Freq: Two times a day (BID) | ORAL | 0 refills | Status: DC
  Filled 2023-09-06: qty 75, 30d supply, fill #0

## 2023-09-08 ENCOUNTER — Other Ambulatory Visit (HOSPITAL_BASED_OUTPATIENT_CLINIC_OR_DEPARTMENT_OTHER): Payer: Self-pay

## 2023-09-08 DIAGNOSIS — Z124 Encounter for screening for malignant neoplasm of cervix: Secondary | ICD-10-CM | POA: Diagnosis not present

## 2023-09-08 DIAGNOSIS — Z1331 Encounter for screening for depression: Secondary | ICD-10-CM | POA: Diagnosis not present

## 2023-09-08 DIAGNOSIS — Z01419 Encounter for gynecological examination (general) (routine) without abnormal findings: Secondary | ICD-10-CM | POA: Diagnosis not present

## 2023-09-08 MED ORDER — SUMATRIPTAN SUCCINATE 100 MG PO TABS
100.0000 mg | ORAL_TABLET | Freq: Once | ORAL | 3 refills | Status: AC
  Filled 2023-09-08: qty 9, 30d supply, fill #0

## 2023-09-19 DIAGNOSIS — M542 Cervicalgia: Secondary | ICD-10-CM | POA: Diagnosis not present

## 2023-10-07 ENCOUNTER — Other Ambulatory Visit (HOSPITAL_BASED_OUTPATIENT_CLINIC_OR_DEPARTMENT_OTHER): Payer: Self-pay

## 2023-10-08 ENCOUNTER — Other Ambulatory Visit (HOSPITAL_BASED_OUTPATIENT_CLINIC_OR_DEPARTMENT_OTHER): Payer: Self-pay

## 2023-10-08 MED ORDER — AMPHETAMINE-DEXTROAMPHETAMINE 20 MG PO TABS
20.0000 mg | ORAL_TABLET | Freq: Two times a day (BID) | ORAL | 0 refills | Status: DC
  Filled 2023-10-08: qty 75, 30d supply, fill #0

## 2023-10-13 DIAGNOSIS — M542 Cervicalgia: Secondary | ICD-10-CM | POA: Diagnosis not present

## 2023-11-05 ENCOUNTER — Other Ambulatory Visit (HOSPITAL_BASED_OUTPATIENT_CLINIC_OR_DEPARTMENT_OTHER): Payer: Self-pay

## 2023-11-05 MED ORDER — AMPHETAMINE-DEXTROAMPHETAMINE 20 MG PO TABS
20.0000 mg | ORAL_TABLET | Freq: Two times a day (BID) | ORAL | 0 refills | Status: DC
  Filled 2023-11-05: qty 75, 30d supply, fill #0

## 2023-12-02 ENCOUNTER — Other Ambulatory Visit (HOSPITAL_BASED_OUTPATIENT_CLINIC_OR_DEPARTMENT_OTHER): Payer: Self-pay

## 2023-12-02 MED ORDER — DOXYCYCLINE HYCLATE 20 MG PO TABS
20.0000 mg | ORAL_TABLET | Freq: Two times a day (BID) | ORAL | 5 refills | Status: AC
  Filled 2023-12-02: qty 60, 30d supply, fill #0

## 2023-12-06 ENCOUNTER — Other Ambulatory Visit (HOSPITAL_BASED_OUTPATIENT_CLINIC_OR_DEPARTMENT_OTHER): Payer: Self-pay

## 2023-12-06 MED ORDER — AMPHETAMINE-DEXTROAMPHETAMINE 20 MG PO TABS
20.0000 mg | ORAL_TABLET | Freq: Three times a day (TID) | ORAL | 0 refills | Status: DC
  Filled 2023-12-06: qty 75, 25d supply, fill #0

## 2023-12-08 ENCOUNTER — Other Ambulatory Visit (HOSPITAL_BASED_OUTPATIENT_CLINIC_OR_DEPARTMENT_OTHER): Payer: Self-pay

## 2024-01-04 ENCOUNTER — Other Ambulatory Visit (HOSPITAL_BASED_OUTPATIENT_CLINIC_OR_DEPARTMENT_OTHER): Payer: Self-pay

## 2024-01-04 MED ORDER — AMPHETAMINE-DEXTROAMPHETAMINE 20 MG PO TABS
20.0000 mg | ORAL_TABLET | Freq: Two times a day (BID) | ORAL | 0 refills | Status: DC
  Filled 2024-01-04: qty 75, 38d supply, fill #0
  Filled 2024-01-04: qty 75, 30d supply, fill #0

## 2024-02-04 ENCOUNTER — Other Ambulatory Visit (HOSPITAL_BASED_OUTPATIENT_CLINIC_OR_DEPARTMENT_OTHER): Payer: Self-pay

## 2024-02-04 MED ORDER — AMPHETAMINE-DEXTROAMPHETAMINE 20 MG PO TABS
20.0000 mg | ORAL_TABLET | Freq: Two times a day (BID) | ORAL | 0 refills | Status: DC
Start: 2024-02-03 — End: 2024-03-06
  Filled 2024-02-04: qty 75, 30d supply, fill #0

## 2024-03-06 ENCOUNTER — Other Ambulatory Visit (HOSPITAL_BASED_OUTPATIENT_CLINIC_OR_DEPARTMENT_OTHER): Payer: Self-pay

## 2024-03-06 MED ORDER — AMPHETAMINE-DEXTROAMPHETAMINE 20 MG PO TABS
20.0000 mg | ORAL_TABLET | Freq: Three times a day (TID) | ORAL | 0 refills | Status: DC
  Filled 2024-03-06: qty 75, 25d supply, fill #0

## 2024-03-07 ENCOUNTER — Other Ambulatory Visit (HOSPITAL_BASED_OUTPATIENT_CLINIC_OR_DEPARTMENT_OTHER): Payer: Self-pay

## 2024-04-03 ENCOUNTER — Other Ambulatory Visit (HOSPITAL_BASED_OUTPATIENT_CLINIC_OR_DEPARTMENT_OTHER): Payer: Self-pay

## 2024-04-03 MED ORDER — AMPHETAMINE-DEXTROAMPHETAMINE 20 MG PO TABS
20.0000 mg | ORAL_TABLET | Freq: Two times a day (BID) | ORAL | 0 refills | Status: DC
  Filled 2024-04-03: qty 75, 30d supply, fill #0

## 2024-05-03 ENCOUNTER — Other Ambulatory Visit: Payer: Self-pay

## 2024-05-03 ENCOUNTER — Other Ambulatory Visit (HOSPITAL_BASED_OUTPATIENT_CLINIC_OR_DEPARTMENT_OTHER): Payer: Self-pay

## 2024-05-03 MED ORDER — AMPHETAMINE-DEXTROAMPHETAMINE 20 MG PO TABS
20.0000 mg | ORAL_TABLET | Freq: Two times a day (BID) | ORAL | 0 refills | Status: DC
  Filled 2024-05-03 (×2): qty 75, 30d supply, fill #0

## 2024-06-03 ENCOUNTER — Other Ambulatory Visit (HOSPITAL_BASED_OUTPATIENT_CLINIC_OR_DEPARTMENT_OTHER): Payer: Self-pay

## 2024-06-05 ENCOUNTER — Other Ambulatory Visit (HOSPITAL_BASED_OUTPATIENT_CLINIC_OR_DEPARTMENT_OTHER): Payer: Self-pay

## 2024-06-05 MED ORDER — AMPHETAMINE-DEXTROAMPHETAMINE 20 MG PO TABS
20.0000 mg | ORAL_TABLET | Freq: Two times a day (BID) | ORAL | 0 refills | Status: DC
  Filled 2024-06-05: qty 75, 30d supply, fill #0

## 2024-07-05 ENCOUNTER — Other Ambulatory Visit (HOSPITAL_BASED_OUTPATIENT_CLINIC_OR_DEPARTMENT_OTHER): Payer: Self-pay

## 2024-07-05 MED ORDER — AMPHETAMINE-DEXTROAMPHETAMINE 20 MG PO TABS
20.0000 mg | ORAL_TABLET | Freq: Two times a day (BID) | ORAL | 0 refills | Status: DC
  Filled 2024-07-05: qty 75, 30d supply, fill #0

## 2024-07-28 ENCOUNTER — Other Ambulatory Visit (HOSPITAL_COMMUNITY): Payer: Self-pay

## 2024-08-02 ENCOUNTER — Other Ambulatory Visit (HOSPITAL_BASED_OUTPATIENT_CLINIC_OR_DEPARTMENT_OTHER): Payer: Self-pay

## 2024-08-02 MED ORDER — AMPHETAMINE-DEXTROAMPHETAMINE 20 MG PO TABS
20.0000 mg | ORAL_TABLET | Freq: Two times a day (BID) | ORAL | 0 refills | Status: DC
  Filled 2024-08-02: qty 75, 30d supply, fill #0

## 2024-09-01 ENCOUNTER — Other Ambulatory Visit (HOSPITAL_BASED_OUTPATIENT_CLINIC_OR_DEPARTMENT_OTHER): Payer: Self-pay

## 2024-09-01 MED ORDER — AMPHETAMINE-DEXTROAMPHETAMINE 20 MG PO TABS
20.0000 mg | ORAL_TABLET | Freq: Two times a day (BID) | ORAL | 0 refills | Status: DC
  Filled 2024-09-01: qty 75, 30d supply, fill #0

## 2024-09-06 ENCOUNTER — Other Ambulatory Visit (HOSPITAL_BASED_OUTPATIENT_CLINIC_OR_DEPARTMENT_OTHER): Payer: Self-pay

## 2024-09-06 MED ORDER — TRETINOIN 0.1 % EX CREA
1.0000 | TOPICAL_CREAM | Freq: Every evening | CUTANEOUS | 2 refills | Status: AC
  Filled 2024-09-06: qty 45, 30d supply, fill #0

## 2024-09-06 MED ORDER — DOXYCYCLINE HYCLATE 20 MG PO TABS
20.0000 mg | ORAL_TABLET | Freq: Two times a day (BID) | ORAL | 11 refills | Status: AC
  Filled 2024-09-06: qty 60, 30d supply, fill #0

## 2024-09-06 MED ORDER — NORITATE 1 % EX CREA
1.0000 | TOPICAL_CREAM | Freq: Two times a day (BID) | CUTANEOUS | 2 refills | Status: AC
  Filled 2024-09-06: qty 60, 30d supply, fill #0

## 2024-09-06 MED ORDER — TAZAROTENE 0.1 % EX CREA
1.0000 | TOPICAL_CREAM | Freq: Every evening | CUTANEOUS | 2 refills | Status: AC
  Filled 2024-09-06: qty 30, 30d supply, fill #0

## 2024-09-06 MED ORDER — DAPSONE 5 % EX GEL
1.0000 | Freq: Two times a day (BID) | CUTANEOUS | 4 refills | Status: AC
  Filled 2024-09-06: qty 60, 30d supply, fill #0

## 2024-09-11 ENCOUNTER — Other Ambulatory Visit (HOSPITAL_BASED_OUTPATIENT_CLINIC_OR_DEPARTMENT_OTHER): Payer: Self-pay

## 2024-09-11 MED ORDER — METRONIDAZOLE 0.75 % EX CREA
1.0000 | TOPICAL_CREAM | Freq: Two times a day (BID) | CUTANEOUS | 3 refills | Status: AC
  Filled 2024-09-11: qty 45, 30d supply, fill #0

## 2024-09-13 ENCOUNTER — Other Ambulatory Visit (HOSPITAL_BASED_OUTPATIENT_CLINIC_OR_DEPARTMENT_OTHER): Payer: Self-pay

## 2024-09-14 ENCOUNTER — Other Ambulatory Visit (HOSPITAL_BASED_OUTPATIENT_CLINIC_OR_DEPARTMENT_OTHER): Payer: Self-pay

## 2024-09-25 ENCOUNTER — Other Ambulatory Visit (HOSPITAL_BASED_OUTPATIENT_CLINIC_OR_DEPARTMENT_OTHER): Payer: Self-pay

## 2024-09-25 MED ORDER — CYCLOBENZAPRINE HCL 5 MG PO TABS
5.0000 mg | ORAL_TABLET | Freq: Three times a day (TID) | ORAL | 0 refills | Status: AC
  Filled 2024-09-25: qty 15, 5d supply, fill #0

## 2024-10-02 ENCOUNTER — Other Ambulatory Visit: Payer: Self-pay

## 2024-10-02 ENCOUNTER — Other Ambulatory Visit (HOSPITAL_BASED_OUTPATIENT_CLINIC_OR_DEPARTMENT_OTHER): Payer: Self-pay

## 2024-10-02 ENCOUNTER — Other Ambulatory Visit (HOSPITAL_COMMUNITY): Payer: Self-pay

## 2024-10-02 MED ORDER — AMPHETAMINE-DEXTROAMPHETAMINE 20 MG PO TABS
20.0000 mg | ORAL_TABLET | Freq: Two times a day (BID) | ORAL | 0 refills | Status: DC
  Filled 2024-10-02: qty 75, 30d supply, fill #0

## 2024-10-05 ENCOUNTER — Other Ambulatory Visit (HOSPITAL_BASED_OUTPATIENT_CLINIC_OR_DEPARTMENT_OTHER): Payer: Self-pay

## 2024-11-02 ENCOUNTER — Other Ambulatory Visit: Payer: Self-pay

## 2024-11-02 ENCOUNTER — Other Ambulatory Visit (HOSPITAL_BASED_OUTPATIENT_CLINIC_OR_DEPARTMENT_OTHER): Payer: Self-pay

## 2024-11-02 MED ORDER — AMPHETAMINE-DEXTROAMPHETAMINE 20 MG PO TABS
20.0000 mg | ORAL_TABLET | Freq: Two times a day (BID) | ORAL | 0 refills | Status: DC
  Filled 2024-11-02: qty 75, 30d supply, fill #0

## 2024-11-07 ENCOUNTER — Other Ambulatory Visit: Payer: Self-pay | Admitting: Obstetrics & Gynecology

## 2024-11-07 DIAGNOSIS — N644 Mastodynia: Secondary | ICD-10-CM

## 2024-11-10 ENCOUNTER — Ambulatory Visit
Admission: RE | Admit: 2024-11-10 | Discharge: 2024-11-10 | Disposition: A | Discharge status: Home / Self Care | Source: Ambulatory Visit | Attending: Obstetrics & Gynecology | Admitting: Obstetrics & Gynecology

## 2024-11-10 DIAGNOSIS — N644 Mastodynia: Secondary | ICD-10-CM

## 2024-11-13 ENCOUNTER — Other Ambulatory Visit

## 2024-11-13 ENCOUNTER — Encounter

## 2024-11-14 ENCOUNTER — Other Ambulatory Visit

## 2024-11-14 ENCOUNTER — Encounter

## 2024-12-01 ENCOUNTER — Other Ambulatory Visit (HOSPITAL_BASED_OUTPATIENT_CLINIC_OR_DEPARTMENT_OTHER): Payer: Self-pay

## 2024-12-01 MED ORDER — AMPHETAMINE-DEXTROAMPHETAMINE 20 MG PO TABS
20.0000 mg | ORAL_TABLET | Freq: Two times a day (BID) | ORAL | 0 refills | Status: AC
  Filled 2024-12-01: qty 75, 30d supply, fill #0
